# Patient Record
Sex: Male | Born: 1965
Health system: Southern US, Community
[De-identification: ages and names within clinical notes are randomized; demographics above are authoritative.]

## PROBLEM LIST (undated history)

## (undated) DIAGNOSIS — G4730 Sleep apnea, unspecified: Secondary | ICD-10-CM

## (undated) DIAGNOSIS — G4733 Obstructive sleep apnea (adult) (pediatric): Secondary | ICD-10-CM

## (undated) DIAGNOSIS — I10 Essential (primary) hypertension: Secondary | ICD-10-CM

## (undated) DIAGNOSIS — F32A Depression, unspecified: Secondary | ICD-10-CM

## (undated) DIAGNOSIS — G4700 Insomnia, unspecified: Secondary | ICD-10-CM

## (undated) DIAGNOSIS — Z9884 Bariatric surgery status: Secondary | ICD-10-CM

## (undated) DIAGNOSIS — G4736 Sleep related hypoventilation in conditions classified elsewhere: Secondary | ICD-10-CM

## (undated) DIAGNOSIS — R0609 Other forms of dyspnea: Secondary | ICD-10-CM

## (undated) DIAGNOSIS — F40240 Claustrophobia: Secondary | ICD-10-CM

## (undated) DIAGNOSIS — F329 Major depressive disorder, single episode, unspecified: Secondary | ICD-10-CM

## (undated) DIAGNOSIS — I5042 Chronic combined systolic (congestive) and diastolic (congestive) heart failure: Secondary | ICD-10-CM

## (undated) DIAGNOSIS — E662 Morbid (severe) obesity with alveolar hypoventilation: Secondary | ICD-10-CM

## (undated) DIAGNOSIS — I4892 Unspecified atrial flutter: Secondary | ICD-10-CM

## (undated) DIAGNOSIS — K5732 Diverticulitis of large intestine without perforation or abscess without bleeding: Secondary | ICD-10-CM

## (undated) HISTORY — PX: LAPAROSCOPIC GASTRIC BANDING: SHX1100

## (undated) HISTORY — DX: Diverticulitis of large intestine without perforation or abscess without bleeding: K57.32

## (undated) HISTORY — DX: Major depressive disorder, single episode, unspecified: F32.9

## (undated) HISTORY — DX: Bariatric surgery status: Z98.84

## (undated) HISTORY — DX: Morbid (severe) obesity with alveolar hypoventilation: E66.2

## (undated) HISTORY — DX: Morbid (severe) obesity due to excess calories: E66.01

## (undated) HISTORY — DX: Depression, unspecified: F32.A

## (undated) HISTORY — DX: Unspecified atrial flutter: I48.92

## (undated) HISTORY — DX: Essential (primary) hypertension: I10

## (undated) HISTORY — DX: Claustrophobia: F40.240

## (undated) HISTORY — DX: Other forms of dyspnea: R06.09

## (undated) HISTORY — DX: Chronic combined systolic (congestive) and diastolic (congestive) heart failure: I50.42

## (undated) HISTORY — DX: Insomnia, unspecified: G47.00

## (undated) HISTORY — PX: HERNIA REPAIR: SHX51

## (undated) HISTORY — PX: UMBILICAL HERNIA REPAIR: SHX196

## (undated) HISTORY — PX: TESTICLE REMOVAL: SHX68

## (undated) HISTORY — DX: Sleep related hypoventilation in conditions classified elsewhere: G47.36

## (undated) HISTORY — DX: Sleep apnea, unspecified: G47.30

---

## 2002-03-04 ENCOUNTER — Encounter: Admission: RE | Admit: 2002-03-04 | Discharge: 2002-03-04 | Payer: Self-pay | Admitting: Urology

## 2002-03-04 ENCOUNTER — Encounter: Payer: Self-pay | Admitting: Urology

## 2003-08-23 ENCOUNTER — Ambulatory Visit (HOSPITAL_BASED_OUTPATIENT_CLINIC_OR_DEPARTMENT_OTHER): Admission: RE | Admit: 2003-08-23 | Discharge: 2003-08-23 | Payer: Self-pay | Admitting: Urology

## 2003-08-23 ENCOUNTER — Encounter (INDEPENDENT_AMBULATORY_CARE_PROVIDER_SITE_OTHER): Payer: Self-pay | Admitting: Specialist

## 2003-08-23 ENCOUNTER — Ambulatory Visit (HOSPITAL_COMMUNITY): Admission: RE | Admit: 2003-08-23 | Discharge: 2003-08-23 | Payer: Self-pay | Admitting: Urology

## 2004-06-08 ENCOUNTER — Encounter (INDEPENDENT_AMBULATORY_CARE_PROVIDER_SITE_OTHER): Payer: Self-pay | Admitting: Specialist

## 2004-06-08 ENCOUNTER — Ambulatory Visit (HOSPITAL_COMMUNITY): Admission: RE | Admit: 2004-06-08 | Discharge: 2004-06-08 | Payer: Self-pay | Admitting: Urology

## 2004-06-08 ENCOUNTER — Ambulatory Visit (HOSPITAL_BASED_OUTPATIENT_CLINIC_OR_DEPARTMENT_OTHER): Admission: RE | Admit: 2004-06-08 | Discharge: 2004-06-08 | Payer: Self-pay | Admitting: Urology

## 2006-05-23 ENCOUNTER — Ambulatory Visit (HOSPITAL_COMMUNITY): Admission: RE | Admit: 2006-05-23 | Discharge: 2006-05-23 | Payer: Self-pay | Admitting: Urology

## 2006-05-23 ENCOUNTER — Encounter (INDEPENDENT_AMBULATORY_CARE_PROVIDER_SITE_OTHER): Payer: Self-pay | Admitting: Specialist

## 2008-09-15 ENCOUNTER — Ambulatory Visit (HOSPITAL_COMMUNITY): Admission: RE | Admit: 2008-09-15 | Discharge: 2008-09-15 | Payer: Self-pay | Admitting: Emergency Medicine

## 2008-11-03 ENCOUNTER — Ambulatory Visit (HOSPITAL_COMMUNITY): Admission: RE | Admit: 2008-11-03 | Discharge: 2008-11-03 | Payer: Self-pay | Admitting: Surgery

## 2008-11-05 ENCOUNTER — Ambulatory Visit (HOSPITAL_COMMUNITY): Admission: RE | Admit: 2008-11-05 | Discharge: 2008-11-05 | Payer: Self-pay | Admitting: Surgery

## 2008-11-16 ENCOUNTER — Encounter: Admission: RE | Admit: 2008-11-16 | Discharge: 2009-01-20 | Payer: Self-pay | Admitting: Surgery

## 2009-02-23 ENCOUNTER — Ambulatory Visit (HOSPITAL_COMMUNITY): Admission: RE | Admit: 2009-02-23 | Discharge: 2009-02-24 | Payer: Self-pay | Admitting: Surgery

## 2009-03-09 ENCOUNTER — Encounter: Admission: RE | Admit: 2009-03-09 | Discharge: 2009-06-07 | Payer: Self-pay | Admitting: Surgery

## 2010-11-19 LAB — HEMOGLOBIN AND HEMATOCRIT, BLOOD
HCT: 46.5 % (ref 39.0–52.0)
Hemoglobin: 15.9 g/dL (ref 13.0–17.0)

## 2010-11-19 LAB — DIFFERENTIAL
Basophils Absolute: 0 10*3/uL (ref 0.0–0.1)
Basophils Absolute: 0.1 10*3/uL (ref 0.0–0.1)
Basophils Relative: 0 % (ref 0–1)
Basophils Relative: 0 % (ref 0–1)
Eosinophils Absolute: 0.3 10*3/uL (ref 0.0–0.7)
Eosinophils Absolute: 0.4 10*3/uL (ref 0.0–0.7)
Eosinophils Relative: 3 % (ref 0–5)
Eosinophils Relative: 3 % (ref 0–5)
Lymphocytes Relative: 16 % (ref 12–46)
Lymphocytes Relative: 16 % (ref 12–46)
Lymphs Abs: 1.9 10*3/uL (ref 0.7–4.0)
Lymphs Abs: 2.3 10*3/uL (ref 0.7–4.0)
Monocytes Absolute: 0.8 10*3/uL (ref 0.1–1.0)
Monocytes Absolute: 0.9 10*3/uL (ref 0.1–1.0)
Monocytes Relative: 6 % (ref 3–12)
Monocytes Relative: 7 % (ref 3–12)
Neutro Abs: 10.6 10*3/uL — ABNORMAL HIGH (ref 1.7–7.7)
Neutro Abs: 8.7 10*3/uL — ABNORMAL HIGH (ref 1.7–7.7)
Neutrophils Relative %: 73 % (ref 43–77)
Neutrophils Relative %: 76 % (ref 43–77)

## 2010-11-19 LAB — COMPREHENSIVE METABOLIC PANEL
ALT: 14 U/L (ref 0–53)
AST: 22 U/L (ref 0–37)
Albumin: 4 g/dL (ref 3.5–5.2)
Alkaline Phosphatase: 92 U/L (ref 39–117)
BUN: 14 mg/dL (ref 6–23)
CO2: 23 mEq/L (ref 19–32)
Calcium: 9.5 mg/dL (ref 8.4–10.5)
Chloride: 112 mEq/L (ref 96–112)
Creatinine, Ser: 1.02 mg/dL (ref 0.4–1.5)
GFR calc Af Amer: >60 mL/min (ref >60)
GFR calc non Af Amer: >60 mL/min (ref >60)
Glucose, Bld: 112 mg/dL — ABNORMAL HIGH (ref 70–99)
Potassium: 3.7 mEq/L (ref 3.5–5.1)
Sodium: 142 mEq/L (ref 135–145)
Total Bilirubin: 0.9 mg/dL (ref 0.3–1.2)
Total Protein: 6.8 g/dL (ref 6.0–8.3)

## 2010-11-19 LAB — CBC
HCT: 45.5 % (ref 39.0–52.0)
HCT: 51.7 % (ref 39.0–52.0)
Hemoglobin: 15.5 g/dL (ref 13.0–17.0)
Hemoglobin: 17.8 g/dL — ABNORMAL HIGH (ref 13.0–17.0)
MCHC: 34.1 g/dL (ref 30.0–36.0)
MCHC: 34.4 g/dL (ref 30.0–36.0)
MCV: 93.4 fL (ref 78.0–100.0)
MCV: 94.3 fL (ref 78.0–100.0)
Platelets: 124 10*3/uL — ABNORMAL LOW (ref 150–400)
Platelets: 143 10*3/uL — ABNORMAL LOW (ref 150–400)
RBC: 4.83 MIL/uL (ref 4.22–5.81)
RBC: 5.53 MIL/uL (ref 4.22–5.81)
RDW: 13 % (ref 11.5–15.5)
RDW: 13.1 % (ref 11.5–15.5)
WBC: 11.9 10*3/uL — ABNORMAL HIGH (ref 4.0–10.5)
WBC: 14.1 10*3/uL — ABNORMAL HIGH (ref 4.0–10.5)

## 2010-12-26 NOTE — Op Note (Signed)
NAME:  Cory Jenkins, Cory Jenkins               ACCOUNT NO.:  [id number]   MEDICAL RECORD NO.:  [id number]          PATIENT TYPE:  OIB   LOCATION:  1522                         FACILITY:  [facility]   PHYSICIAN:  Thornton Park. Daphine Deutscher, MD  DATE OF BIRTH:  1966/05/05   DATE OF PROCEDURE:  02/23/2009  DATE OF DISCHARGE:                               OPERATIVE REPORT   PREOPERATIVE DIAGNOSIS:  Morbid obesity with umbilical hernia that has  been symptomatic.   POSTOPERATIVE DIAGNOSIS:  Morbid obesity with umbilical hernia that has  been symptomatic with incarcerated umbilical hernia containing  properitoneal fat.   PROCEDURE:  Lap band APL with umbilical hernia repair using an Ethicon 2  cm patch.   SURGEON:  Thornton Park. Daphine Deutscher, M.D.   ASSISTANT:  Sharlet Salina T. Hoxworth, M.D.   ANESTHESIA:  General endotracheal.   DESCRIPTION OF PROCEDURE:  [name] was taken to room 1 on  Wednesday, February 23, 2009 and given general anesthesia.  The abdomen was  prepped with a technique air equivalent and draped sterilely.  The  abdomen was entered going to the left upper quadrant using a 0 degree  OptiVu 10 mm without difficulty.  Once insufflation was achieved, we  used standard trocar placements.  He has a very large abdomen, and we  first put in a Nathanson retractor above and with a 10 in the right  upper quadrant and 11 below that, we visualized his umbilical hernia.  This was a very deep hernia at the bottom of a very deep __________ with  incarcerated properitoneal fat.  We will address that at the end.  Meanwhile, we went ahead and dissected the foregut, created a window on  the left side for the band. He had a lot of fat in the gastrohepatic  window.  This made this amenable to an APL band.  This was inserted and  tucked into the band passer and brought around and engaged in the  buckle.  The buckle snapped over the calibration tubing.  There is no  evidence of a hiatal hernia on upper GI nor were there  symptoms of one.  The band was then plicated with 3 sutures, and this looked good.  The  band was left in the abdomen wall.  We went down and inverted the  properitoneal hernia, excising the excess and the incarcerated fat.  I  then removed the excess from the inside, and we inserted the Ethicon  dual-sided umbilical hernia mesh, 2 cm diameter, and slid it in, pulled  it back, and sutured it to the small fascial defect, which was about a  centimeter in diameter with 2 sutures of #1 Novofil.  Wounds were  irrigated.  Meanwhile, the band tubing was connected to the port, which  had mesh on the  back and was tucked in the subcutaneous pocket in the lateral aspect of  the right lower incision.  All wounds were irrigated, injected with  Marcaine, and closed with 4-0 Vicryl subcutaneously, Benzoin, Steri-  Strips.  The patient seemed to tolerate the procedure well and was taken  to recovery room in  satisfactory condition.      Thornton Park Daphine Deutscher, MD  Electronically Signed     MBM/MEDQ  D:  02/23/2009  T:  02/23/2009  Job:  [id number]   cc:   Brett Canales A. Cleta Alberts, M.D.  Fax: [phone]

## 2011-09-13 ENCOUNTER — Telehealth (INDEPENDENT_AMBULATORY_CARE_PROVIDER_SITE_OTHER): Payer: Self-pay | Admitting: Surgery

## 2011-09-13 NOTE — Telephone Encounter (Signed)
09/13/11 recall letter mailed to patient for bariatric surgery follow-up. Adv pt to call our office at [phone] to schedule an appointment. CEF °

## 2012-08-29 ENCOUNTER — Telehealth (INDEPENDENT_AMBULATORY_CARE_PROVIDER_SITE_OTHER): Payer: Self-pay | Admitting: Surgery

## 2012-08-29 NOTE — Telephone Encounter (Signed)
08/29/12 spk to wife left message for pt to call and schedule a bariatric surgery follow-up appt with Dr. Daphine Deutscher. Also mailed recall letter. Pt hasn't been seen since lap band surgery on 02/23/09. (lss)

## 2015-02-16 ENCOUNTER — Ambulatory Visit (INDEPENDENT_AMBULATORY_CARE_PROVIDER_SITE_OTHER): Payer: BLUE CROSS/BLUE SHIELD | Admitting: Emergency Medicine

## 2015-02-16 ENCOUNTER — Encounter (HOSPITAL_COMMUNITY): Payer: Self-pay | Admitting: Emergency Medicine

## 2015-02-16 ENCOUNTER — Ambulatory Visit (INDEPENDENT_AMBULATORY_CARE_PROVIDER_SITE_OTHER): Payer: BLUE CROSS/BLUE SHIELD

## 2015-02-16 ENCOUNTER — Inpatient Hospital Stay (HOSPITAL_COMMUNITY)
Admission: EM | Admit: 2015-02-16 | Discharge: 2015-02-20 | DRG: 392 | Disposition: A | Discharge status: Home / Self Care | Payer: BLUE CROSS/BLUE SHIELD | Attending: General Surgery | Admitting: General Surgery

## 2015-02-16 ENCOUNTER — Emergency Department (HOSPITAL_COMMUNITY): Payer: BLUE CROSS/BLUE SHIELD

## 2015-02-16 VITALS — BP 130/78 | HR 104 | Temp 98.4°F | Resp 16 | Ht 72.0 in | Wt 269.6 lb

## 2015-02-16 DIAGNOSIS — D72829 Elevated white blood cell count, unspecified: Secondary | ICD-10-CM

## 2015-02-16 DIAGNOSIS — R3129 Other microscopic hematuria: Secondary | ICD-10-CM

## 2015-02-16 DIAGNOSIS — F17210 Nicotine dependence, cigarettes, uncomplicated: Secondary | ICD-10-CM | POA: Diagnosis present

## 2015-02-16 DIAGNOSIS — Z9884 Bariatric surgery status: Secondary | ICD-10-CM

## 2015-02-16 DIAGNOSIS — K5780 Diverticulitis of intestine, part unspecified, with perforation and abscess without bleeding: Secondary | ICD-10-CM

## 2015-02-16 DIAGNOSIS — R5383 Other fatigue: Secondary | ICD-10-CM | POA: Diagnosis not present

## 2015-02-16 DIAGNOSIS — R509 Fever, unspecified: Secondary | ICD-10-CM | POA: Diagnosis not present

## 2015-02-16 DIAGNOSIS — R1030 Lower abdominal pain, unspecified: Secondary | ICD-10-CM

## 2015-02-16 DIAGNOSIS — R822 Biliuria: Secondary | ICD-10-CM

## 2015-02-16 DIAGNOSIS — K5732 Diverticulitis of large intestine without perforation or abscess without bleeding: Secondary | ICD-10-CM | POA: Diagnosis present

## 2015-02-16 DIAGNOSIS — F329 Major depressive disorder, single episode, unspecified: Secondary | ICD-10-CM | POA: Diagnosis present

## 2015-02-16 DIAGNOSIS — R312 Other microscopic hematuria: Secondary | ICD-10-CM

## 2015-02-16 DIAGNOSIS — K5720 Diverticulitis of large intestine with perforation and abscess without bleeding: Secondary | ICD-10-CM | POA: Diagnosis not present

## 2015-02-16 HISTORY — DX: Diverticulitis of large intestine without perforation or abscess without bleeding: K57.32

## 2015-02-16 HISTORY — DX: Bariatric surgery status: Z98.84

## 2015-02-16 LAB — POCT CBC
Granulocyte percent: 87.7 %G — AB (ref 37–80)
HCT, POC: 48.8 % (ref 43.5–53.7)
Hemoglobin: 16.6 g/dL (ref 14.1–18.1)
Lymph, poc: 1.2 (ref 0.6–3.4)
MCH, POC: 30.2 pg (ref 27–31.2)
MCHC: 34 g/dL (ref 31.8–35.4)
MCV: 88.9 fL (ref 80–97)
MID (cbc): 0.6 (ref 0–0.9)
MPV: 9.1 fL (ref 0–99.8)
POC Granulocyte: 13.5 — AB (ref 2–6.9)
POC LYMPH PERCENT: 8.1 %L — AB (ref 10–50)
POC MID %: 4.2 %M (ref 0–12)
Platelet Count, POC: 165 10*3/uL (ref 142–424)
RBC: 5.49 M/uL (ref 4.69–6.13)
RDW, POC: 13.9 %
WBC: 15.4 10*3/uL — AB (ref 4.6–10.2)

## 2015-02-16 LAB — POCT URINALYSIS DIPSTICK
Glucose, UA: NEGATIVE
Leukocytes, UA: NEGATIVE
Nitrite, UA: POSITIVE
Protein, UA: 100
Spec Grav, UA: >=1.03
Urobilinogen, UA: 2
pH, UA: 5.5

## 2015-02-16 LAB — CBC WITH DIFFERENTIAL/PLATELET
Basophils Absolute: 0 10*3/uL (ref 0.0–0.1)
Basophils Relative: 0 % (ref 0–1)
EOS PCT: 1 % (ref 0–5)
Eosinophils Absolute: 0.1 10*3/uL (ref 0.0–0.7)
HCT: 48.4 % (ref 39.0–52.0)
HEMOGLOBIN: 16.7 g/dL (ref 13.0–17.0)
LYMPHS ABS: 1.1 10*3/uL (ref 0.7–4.0)
LYMPHS PCT: 8 % — AB (ref 12–46)
MCH: 31.6 pg (ref 26.0–34.0)
MCHC: 34.5 g/dL (ref 30.0–36.0)
MCV: 91.5 fL (ref 78.0–100.0)
MONOS PCT: 8 % (ref 3–12)
Monocytes Absolute: 1.2 10*3/uL — ABNORMAL HIGH (ref 0.1–1.0)
Neutro Abs: 11.8 10*3/uL — ABNORMAL HIGH (ref 1.7–7.7)
Neutrophils Relative %: 83 % — ABNORMAL HIGH (ref 43–77)
Platelets: 140 10*3/uL — ABNORMAL LOW (ref 150–400)
RBC: 5.29 MIL/uL (ref 4.22–5.81)
RDW: 13.7 % (ref 11.5–15.5)
WBC: 14.3 10*3/uL — AB (ref 4.0–10.5)

## 2015-02-16 LAB — POC HEMOCCULT BLD/STL (OFFICE/1-CARD/DIAGNOSTIC): Fecal Occult Blood, POC: NEGATIVE

## 2015-02-16 LAB — I-STAT CG4 LACTIC ACID, ED: Lactic Acid, Venous: 0.91 mmol/L (ref 0.5–2.0)

## 2015-02-16 LAB — POCT UA - MICROSCOPIC ONLY
Bacteria, U Microscopic: NEGATIVE
Casts, Ur, LPF, POC: NEGATIVE
Crystals, Ur, HPF, POC: NEGATIVE
Epithelial cells, urine per micros: NEGATIVE
Mucus, UA: POSITIVE
Yeast, UA: NEGATIVE

## 2015-02-16 LAB — COMPREHENSIVE METABOLIC PANEL
ALBUMIN: 3.7 g/dL (ref 3.5–5.0)
ALT: 9 U/L — AB (ref 17–63)
AST: 14 U/L — ABNORMAL LOW (ref 15–41)
Alkaline Phosphatase: 75 U/L (ref 38–126)
Anion gap: 8 (ref 5–15)
BUN: 13 mg/dL (ref 6–20)
CO2: 23 mmol/L (ref 22–32)
Calcium: 8.9 mg/dL (ref 8.9–10.3)
Chloride: 104 mmol/L (ref 101–111)
Creatinine, Ser: 1.07 mg/dL (ref 0.61–1.24)
GFR calc non Af Amer: >60 mL/min (ref >60)
GLUCOSE: 94 mg/dL (ref 65–99)
POTASSIUM: 4 mmol/L (ref 3.5–5.1)
Sodium: 135 mmol/L (ref 135–145)
TOTAL PROTEIN: 6.9 g/dL (ref 6.5–8.1)
Total Bilirubin: 1 mg/dL (ref 0.3–1.2)

## 2015-02-16 LAB — GLUCOSE, POCT (MANUAL RESULT ENTRY): POC Glucose: 89 mg/dl (ref 70–99)

## 2015-02-16 LAB — LIPASE, BLOOD: Lipase: 11 U/L — ABNORMAL LOW (ref 22–51)

## 2015-02-16 MED ORDER — CIPROFLOXACIN IN D5W 400 MG/200ML IV SOLN
400.0000 mg | Freq: Two times a day (BID) | INTRAVENOUS | Status: DC
  Administered 2015-02-16 – 2015-02-19 (×7): 400 mg via INTRAVENOUS
  Filled 2015-02-16 (×8): qty 200

## 2015-02-16 MED ORDER — NORTRIPTYLINE HCL 25 MG PO CAPS
75.0000 mg | ORAL_CAPSULE | Freq: Every day | ORAL | Status: DC
  Administered 2015-02-16: 75 mg via ORAL
  Filled 2015-02-16 (×2): qty 3

## 2015-02-16 MED ORDER — ENOXAPARIN SODIUM 40 MG/0.4ML ~~LOC~~ SOLN
40.0000 mg | SUBCUTANEOUS | Status: DC
  Administered 2015-02-16 – 2015-02-19 (×4): 40 mg via SUBCUTANEOUS
  Filled 2015-02-16 (×6): qty 0.4

## 2015-02-16 MED ORDER — ZOLPIDEM TARTRATE 10 MG PO TABS
10.0000 mg | ORAL_TABLET | Freq: Every evening | ORAL | Status: DC | PRN
  Administered 2015-02-18 – 2015-02-19 (×2): 10 mg via ORAL
  Filled 2015-02-16 (×2): qty 1

## 2015-02-16 MED ORDER — IOHEXOL 300 MG/ML  SOLN
100.0000 mL | Freq: Once | INTRAMUSCULAR | Status: AC | PRN
  Administered 2015-02-16: 100 mL via INTRAVENOUS

## 2015-02-16 MED ORDER — IOHEXOL 300 MG/ML  SOLN
50.0000 mL | Freq: Once | INTRAMUSCULAR | Status: AC | PRN
  Administered 2015-02-16: 50 mL via ORAL

## 2015-02-16 MED ORDER — ONDANSETRON HCL 4 MG/2ML IJ SOLN: 4.0000 mg | Freq: Once | INTRAMUSCULAR | Status: DC

## 2015-02-16 MED ORDER — ONDANSETRON HCL 4 MG/2ML IJ SOLN
4.0000 mg | Freq: Four times a day (QID) | INTRAMUSCULAR | Status: DC | PRN
  Administered 2015-02-18: 4 mg via INTRAVENOUS
  Filled 2015-02-16: qty 2

## 2015-02-16 MED ORDER — SODIUM CHLORIDE 0.9 % IV BOLUS (SEPSIS)
1000.0000 mL | Freq: Once | INTRAVENOUS | Status: AC
  Administered 2015-02-16: 1000 mL via INTRAVENOUS

## 2015-02-16 MED ORDER — POTASSIUM CHLORIDE IN NACL 20-0.9 MEQ/L-% IV SOLN
INTRAVENOUS | Status: DC
  Administered 2015-02-16: 125 mL/h via INTRAVENOUS
  Administered 2015-02-17 (×2): via INTRAVENOUS
  Administered 2015-02-18: 75 mL/h via INTRAVENOUS
  Administered 2015-02-18: 125 mL/h via INTRAVENOUS
  Administered 2015-02-19 – 2015-02-20 (×2): via INTRAVENOUS
  Filled 2015-02-16 (×9): qty 1000

## 2015-02-16 MED ORDER — OXYCODONE HCL 5 MG PO TABS
5.0000 mg | ORAL_TABLET | ORAL | Status: DC | PRN
  Administered 2015-02-17 – 2015-02-18 (×2): 5 mg via ORAL
  Filled 2015-02-16 (×2): qty 1

## 2015-02-16 MED ORDER — HYDROMORPHONE HCL 1 MG/ML IJ SOLN
1.0000 mg | INTRAMUSCULAR | Status: DC | PRN
  Administered 2015-02-16 – 2015-02-18 (×4): 1 mg via INTRAVENOUS
  Filled 2015-02-16 (×4): qty 1

## 2015-02-16 MED ORDER — MORPHINE SULFATE 4 MG/ML IJ SOLN
4.0000 mg | Freq: Once | INTRAMUSCULAR | Status: AC
  Administered 2015-02-16: 4 mg via INTRAVENOUS
  Filled 2015-02-16: qty 1

## 2015-02-16 MED ORDER — METRONIDAZOLE IN NACL 5-0.79 MG/ML-% IV SOLN
500.0000 mg | Freq: Once | INTRAVENOUS | Status: AC
  Administered 2015-02-16: 500 mg via INTRAVENOUS
  Filled 2015-02-16: qty 100

## 2015-02-16 MED ORDER — ONDANSETRON HCL 4 MG/2ML IJ SOLN
4.0000 mg | Freq: Once | INTRAMUSCULAR | Status: AC
  Administered 2015-02-16: 4 mg via INTRAVENOUS
  Filled 2015-02-16: qty 2

## 2015-02-16 MED ORDER — CIPROFLOXACIN IN D5W 400 MG/200ML IV SOLN
400.0000 mg | Freq: Once | INTRAVENOUS | Status: DC
  Filled 2015-02-16: qty 200

## 2015-02-16 MED ORDER — METRONIDAZOLE IN NACL 5-0.79 MG/ML-% IV SOLN
500.0000 mg | Freq: Three times a day (TID) | INTRAVENOUS | Status: DC
  Administered 2015-02-17 – 2015-02-20 (×10): 500 mg via INTRAVENOUS
  Filled 2015-02-16 (×11): qty 100

## 2015-02-16 NOTE — ED Notes (Signed)
Bed: WA24 Expected date:  Expected time:  Means of arrival:  [facility]

## 2015-02-16 NOTE — ED Notes (Signed)
Patient transported to CT 

## 2015-02-16 NOTE — H&P (Signed)
Cory Jenkins is an 49 y.o. male.    Chief Complaint: Abdominal pain  HPI: Patient is a 49 year old male who was in his usual state of health until 3 days ago when he developed the fairly rapid onset of lower midline abdominal pain that was quite severe. He was taking antibiotics for a dental procedure at that time and had had some GI issues with oral antibiotics before. He thought this was the problem and stop the amoxicillin. He stated the pain was most intense 3 days ago but has been constant and still significant over the last couple of days. He says today is a [place] better than yesterday. He had chills 3 days ago but none since that time. Normal bowel movement yesterday. No melena or hematochezia or diarrhea. He has not had any similar GI issues or history of diverticulitis. He does have a lap band placed which causes occasional vomiting but no different than what he is used to. No urinary symptoms. He presented to urgent care due to the persistent pain and was referred to the [facility] emergency room for further evaluation.  Past Medical History  Diagnosis Date  . Depression     Past Surgical History  Procedure Laterality Date  . Laparoscopic gastric banding    . Orchiectomy      History reviewed. No pertinent family history. Social History:  reports that he has been smoking.  He has never used smokeless tobacco. He reports that he does not drink alcohol or use illicit drugs.  Allergies:  Allergies  Allergen Reactions  . Keflex [Cephalexin] Other (See Comments)    Abdominal pain  . Sulfa Antibiotics Hives     (Not in a hospital admission)  Results for orders placed or performed during the hospital encounter of 02/16/15 (from the past 48 hour(s))  CBC with Differential     Status: Abnormal   Collection Time: 02/16/15  3:17 PM  Result Value Ref Range   WBC 14.3 (H) 4.0 - 10.5 K/uL   RBC 5.29 4.22 - 5.81 MIL/uL   Hemoglobin 16.7 13.0 - 17.0 g/dL   HCT 48.4 39.0 - 52.0 %    MCV 91.5 78.0 - 100.0 fL   MCH 31.6 26.0 - 34.0 pg   MCHC 34.5 30.0 - 36.0 g/dL   RDW 13.7 11.5 - 15.5 %   Platelets 140 (L) 150 - 400 K/uL   Neutrophils Relative % 83 (H) 43 - 77 %   Neutro Abs 11.8 (H) 1.7 - 7.7 K/uL   Lymphocytes Relative 8 (L) 12 - 46 %   Lymphs Abs 1.1 0.7 - 4.0 K/uL   Monocytes Relative 8 3 - 12 %   Monocytes Absolute 1.2 (H) 0.1 - 1.0 K/uL   Eosinophils Relative 1 0 - 5 %   Eosinophils Absolute 0.1 0.0 - 0.7 K/uL   Basophils Relative 0 0 - 1 %   Basophils Absolute 0.0 0.0 - 0.1 K/uL  Comprehensive metabolic panel     Status: Abnormal   Collection Time: 02/16/15  3:17 PM  Result Value Ref Range   Sodium 135 135 - 145 mmol/L   Potassium 4.0 3.5 - 5.1 mmol/L   Chloride 104 101 - 111 mmol/L   CO2 23 22 - 32 mmol/L   Glucose, Bld 94 65 - 99 mg/dL   BUN 13 6 - 20 mg/dL   Creatinine, Ser 1.07 0.61 - 1.24 mg/dL   Calcium 8.9 8.9 - 10.3 mg/dL   Total Protein 6.9 6.5 -  8.1 g/dL   Albumin 3.7 3.5 - 5.0 g/dL   AST 14 (L) 15 - 41 U/L   ALT 9 (L) 17 - 63 U/L   Alkaline Phosphatase 75 38 - 126 U/L   Total Bilirubin 1.0 0.3 - 1.2 mg/dL   GFR calc non Af Amer >60 >60 mL/min   GFR calc Af Amer >60 >60 mL/min    Comment: (NOTE) The eGFR has been calculated using the CKD EPI equation. This calculation has not been validated in all clinical situations. eGFR's persistently <60 mL/min signify possible Chronic Kidney Disease.    Anion gap 8 5 - 15  Lipase, blood     Status: Abnormal   Collection Time: 02/16/15  3:17 PM  Result Value Ref Range   Lipase 11 (L) 22 - 51 U/L  I-Stat CG4 Lactic Acid, ED     Status: None   Collection Time: 02/16/15  7:56 PM  Result Value Ref Range   Lactic Acid, Venous 0.91 0.5 - 2.0 mmol/L   Ct Abdomen Pelvis W Contrast  02/16/2015   CLINICAL DATA:  49 year old male with history of nausea, fever, chills and lower abdominal pain for the past 3 days. No diarrhea or emesis.  EXAM: CT ABDOMEN AND PELVIS WITH CONTRAST  TECHNIQUE:  Multidetector CT imaging of the abdomen and pelvis was performed using the standard protocol following bolus administration of intravenous contrast.  CONTRAST:  13m OMNIPAQUE IOHEXOL 300 MG/ML  SOLN  COMPARISON:  No priors.  FINDINGS: Lower chest:  Lipomatous hypertrophy of the interatrial septum.  Hepatobiliary: No cystic or solid hepatic lesions. No intra or extrahepatic biliary ductal dilatation. 2 mm gallstone lying dependently in the neck of the gallbladder. No current findings to suggest an acute cholecystitis at this time.  Pancreas: No pancreatic mass. No pancreatic ductal dilatation. No pancreatic or peripancreatic fluid or inflammatory changes.  Spleen: Unremarkable.  Adrenals/Urinary Tract: 4 cm well-defined nonenhancing lesion in the upper pole of the right kidney is compatible with an exophytic simple cyst. Left kidney is normal in appearance. Bilateral adrenal glands are normal in appearance. No hydroureteronephrosis. Urinary bladder is normal in appearance.  Stomach/Bowel: In the mid sigmoid colon there are extensive inflammatory changes adjacent to several colonic diverticulitis. Image 76 of series 2 and coronal image 53 of series 5 demonstrate what appears to be a contained perforation within the sigmoid mesocolon adjacent to an inflamed diverticulum. This has gas and a small amount of feculent material, which could represent an early diverticular abscess. This measures approximately 2.3 x 1.3 cm.  Vascular/Lymphatic: Atherosclerosis throughout the abdominal and pelvic vasculature, without evidence of aneurysm. No lymphadenopathy noted in the abdomen or pelvis.  Reproductive: Prostate gland and seminal vesicles are unremarkable in appearance.  Other: Trace volume of ascites. Although there is a small amount of extraluminal gas within the sigmoid mesocolon, this appears be isolated. No pneumoperitoneum is otherwise noted. LapBand in position, with reservoir in the subcutaneous fat overlying the  right side of the mid abdomen.  Musculoskeletal: There are no aggressive appearing lytic or blastic lesions noted in the visualized portions of the skeleton.  IMPRESSION: 1. Severe acute diverticulitis of the mid sigmoid colon with a contained perforation in the sigmoid mesocolon, with what appears to be a small developing diverticular abscess, as detailed above. Surgical consultation is strongly recommended. 2. Cholelithiasis without evidence acute cholecystitis. 3. Additional incidental findings, as above. These results were called by telephone at the time of interpretation on 02/16/2015 at 7:00 pm to  Dr. Delos Haring, who verbally acknowledged these results.   Electronically Signed   By: Vinnie Langton M.D.   On: 02/16/2015 19:00   Dg Abd Acute W/chest  02/16/2015   CLINICAL DATA:  49 year old male with lower abdominal pain. Initial encounter.  EXAM: DG ABDOMEN ACUTE W/ 1V CHEST  COMPARISON:  02/24/2009.  FINDINGS: Lung volumes are within normal limits. Normal cardiac size and mediastinal contours. No pneumothorax or pneumoperitoneum. No confluent pulmonary opacity.  Large body habitus and quantum mottle artifact. Sequelae of gastric banding again noted. Anatomic detail of the band today is limited. Non obstructed bowel gas pattern. Abdominal and pelvic visceral contours are within normal limits. No acute osseous abnormality identified.  IMPRESSION: 1.  Normal bowel gas pattern, no free air. 2. Sequelae of gastric banding re- identified, but detail of the band is limited on these images. 3.  No acute cardiopulmonary abnormality.   Electronically Signed   By: Genevie Ann M.D.   On: 02/16/2015 15:22    Review of Systems  Constitutional: Positive for chills and malaise/fatigue. Negative for fever.  Respiratory: Negative.   Cardiovascular: Negative.   Gastrointestinal: Positive for abdominal pain. Negative for nausea, vomiting, diarrhea, constipation and blood in stool.  Genitourinary: Negative.    Psychiatric/Behavioral: Positive for depression.    Blood pressure 139/94, pulse 107, temperature 100.1 F (37.8 C), temperature source Oral, resp. rate 20, height 6' (1.829 m), weight 117.935 kg (260 lb), SpO2 98 %. Physical Exam  General: Alert, Obese Caucasian male, in no distress Skin: Warm and dry without rash or infection. HEENT: No palpable masses or thyromegaly. Sclera nonicteric. Pupils equal round and reactive. Oropharynx clear. Lymph nodes: No cervical, supraclavicular, or inguinal nodes palpable. Lungs: Breath sounds clear and equal without increased work of breathing Cardiovascular: Regular rate and rhythm without murmur. No  edema. Peripheral pulses intact. Abdomen: Obese. Bowel sounds present. There is localized suprapubic tenderness with minimal guarding and no peritoneal signs. Remainder of his abdomen is soft and nontender. LAP-BAND port in right upper quadrant unremarkable.. No masses palpable. No organomegaly. No palpable hernias. Extremities: No edema or joint swelling or deformity. No chronic venous stasis changes. Neurologic: Alert and fully oriented. Affect normal.  Assessment/Plan Acute sigmoid diverticulitis. He has a very significant phlegmon around the sigmoid colon with a pocket of extraluminal air probably within the mesentery. His abdomen is relatively benign on exam. We will admit for bowel rest, IV antibiotics and close observation with initial nonoperative management. Discussed with the patient and his family and all questions answered.  Dominic Mahaney T 02/16/2015, 8:39 PM

## 2015-02-16 NOTE — Progress Notes (Addendum)
Subjective:  This chart was scribed for Cory Lites, MD by Andrew Au, ED Scribe. This patient was seen in room 12 and the patient's care was started at 1:01 PM.   Patient ID: Cory Jenkins, male    DOB: [birth year], 49 y.o.   MRN: [id number]  HPI   Chief Complaint  Patient presents with  . Abdominal Pain  . Nausea   HPI Comments: Cory Jenkins is a 49 y.o. male who presents to the [facility] complaining of low abdominal pain 5-6 days ago. Pt had tooth extracted 6 days ago and was taking amoxicillin. He began to have low abdominal pain and stopped taking medication, but reports abdominal pain has persisted. He reports associated sweats last night, nausea and decreased appetite. He's had normal stools and denies diarrhea.  Pt states he's had similar reaction when taking keflex in the past. He denies testicular swelling and pain.   No past medical history on file. No past surgical history on file. Prior to Admission medications   Medication Sig Start Date End Date Taking? Authorizing Provider  nortriptyline (PAMELOR) 25 MG capsule Take 30 mg by mouth 3 (three) times daily.   Yes Historical Provider, MD  oxyCODONE-acetaminophen (PERCOCET) 10-325 MG per tablet Take 1 tablet by mouth every 4 (four) hours as needed for pain.   Yes Historical Provider, MD  zolpidem (AMBIEN) 10 MG tablet Take 10 mg by mouth at bedtime as needed for sleep.   Yes Historical Provider, MD   Review of Systems  Constitutional: Positive for diaphoresis and appetite change (decreased).  Gastrointestinal: Positive for nausea and abdominal pain. Negative for vomiting, diarrhea and constipation.  Genitourinary: Negative for scrotal swelling and testicular pain.   Objective:   Physical Exam CONSTITUTIONAL: Well developed/well nourished HEAD: Normocephalic/atraumatic EYES: EOMI/PERRL ENMT: Mucous membranes moist NECK: supple no meningeal signs SPINE/BACK:entire spine nontender CV: S1/S2 noted,  no murmurs/rubs/gallops noted LUNGS: Lungs are clear to auscultation bilaterally, no apparent distress ABDOMEN: soft, no rebound or guarding, bowel sounds noted throughout abdomen. Not distended. Significant tenderness to left lower quadrant and suprapubic.  GU:no cva tenderness prostate normal. Surgically absent left testicle.  NEURO: Pt is awake/alert/appropriate, moves all extremitiesx4.  No facial droop.   EXTREMITIES: pulses normal/equal, full ROM SKIN: warm, color normal PSYCH: no abnormalities of mood noted, alert and oriented to situation  Filed Vitals:   02/16/15 1255  BP: 130/78  Pulse: 104  Temp: 98.4 F (36.9 C)  TempSrc: Oral  Resp: 16  Height: 6' (1.829 m)  Weight: 269 lb 9.6 oz (122.29 kg)  SpO2: 97%   Results for orders placed or performed in visit on 02/16/15  POCT CBC  Result Value Ref Range   WBC 15.4 (A) 4.6 - 10.2 K/uL   Lymph, poc 1.2 0.6 - 3.4   POC LYMPH PERCENT 8.1 (A) 10 - 50 %L   MID (cbc) 0.6 0 - 0.9   POC MID % 4.2 0 - 12 %M   POC Granulocyte 13.5 (A) 2 - 6.9   Granulocyte percent 87.7 (A) 37 - 80 %G   RBC 5.49 4.69 - 6.13 M/uL   Hemoglobin 16.6 14.1 - 18.1 g/dL   HCT, POC 40.9 81.1 - 53.7 %   MCV 88.9 80 - 97 fL   MCH, POC 30.2 27 - 31.2 pg   MCHC 34.0 31.8 - 35.4 g/dL   RDW, POC 91.4 %   Platelet Count, POC 165 142 - 424 K/uL  MPV 9.1 0 - 99.8 fL  POCT glucose (manual entry)  Result Value Ref Range   POC Glucose 89 70 - 99 mg/dl  POCT urinalysis dipstick  Result Value Ref Range   Color, UA brown    Clarity, UA cloudy    Glucose, UA neg    Bilirubin, UA moderate    Ketones, UA trace    Spec Grav, UA >=1.030    Blood, UA large    pH, UA 5.5    Protein, UA 100    Urobilinogen, UA 2.0    Nitrite, UA pos    Leukocytes, UA Negative Negative  POC Hemoccult Bld/Stl (1-Cd Office Dx)  Result Value Ref Range   Card #1 Date     Fecal Occult Blood, POC Negative Negative   [facility] reading (PRIMARY) by Dr. Cleta Albertsaub. Abdomen.  There are 2 small  air-fluid levels in the right lower quadrant paucity of air in the left side of the abdomen. There is a lap band  Apparatus noted  Assessment & Plan:   Patient presents with severe lower abdominal pain. He has an acute abdomen. Symptoms have been present for 3 days. He does have a LAP-BAND. I did send off a urine culture. He has significant bilirubin in his urine etiology unknown. [facility] so they can proceed with further evaluation and imaging. I discussed the issue with his father who is a cardiologist.

## 2015-02-16 NOTE — Progress Notes (Signed)
Utilization Review completed.  Iracema Lanagan RN CM  

## 2015-02-16 NOTE — ED Provider Notes (Signed)
CSN: [id number]     Arrival date & time 02/16/15  1437 History   First MD Initiated Contact with Patient 02/16/15 1631     Chief Complaint  Patient presents with  . Abdominal Pain     (Consider location/radiation/quality/duration/timing/severity/associated sxs/prior Treatment) HPI    PCP: No primary care provider on file. Blood pressure 125/74, pulse 105, temperature 98.5 F (36.9 C), temperature source Oral, resp. rate 18, height 6' (1.829 m), weight 260 lb (117.935 kg), SpO2 97 %.  Cory Jenkins is a 49 y.o.male with a significant PMH of nort presents to the ER with complaints of depression.    AL is an otherwise healthy 49 y/o male who presents to the ED with 7/10 crampy suprapubic abdominal pain x 3 days, he feels his pain is actually improving it was previously 10/10. He recently had a tooth extraction and was on amoxicillin which he discontinued on 3 days ago because he thought it was the source of his pain, as he had similar cramping from keflex, but the pain has not gone away. He reports having a decreased appetite, mild nausea, and chills. He denies having fever, vomiting, diarrhea, dysuria, frequency, or urgency. He has been taking percocet for his pain that he had from his dental work, with good relief of his pain. He has h/o kidney stone and lap-band surgery, but still has all abdominal organs.  The patient denies diaphoresis, fever, headache, weakness (general or focal), confusion, change of vision,  neck pain, dysphagia, aphagia, chest pain, shortness of breath,  back pain, nausea, lower extremity swelling, rash.   Past Medical History  Diagnosis Date  . Depression    Past Surgical History  Procedure Laterality Date  . Laparoscopic gastric banding     History reviewed. No pertinent family history. History  Substance Use Topics  . Smoking status: Light Tobacco Smoker  . Smokeless tobacco: Not on file  . Alcohol Use: Not on file    Review of Systems  10 Systems  reviewed and are negative for acute change except as noted in the HPI.     Allergies  Keflex and Sulfa antibiotics  Home Medications   Prior to Admission medications   Medication Sig Start Date End Date Taking? Authorizing Provider  nortriptyline (PAMELOR) 25 MG capsule Take 75 mg by mouth daily.    Yes Historical Provider, MD  oxyCODONE-acetaminophen (PERCOCET/ROXICET) 5-325 MG per tablet Take 1-2 tablets by mouth every 4 (four) hours as needed for moderate pain or severe pain.  02/10/15  Yes Historical Provider, MD  zolpidem (AMBIEN) 10 MG tablet Take 10 mg by mouth at bedtime as needed for sleep.   Yes Historical Provider, MD   BP 125/74 mmHg  Pulse 105  Temp(Src) 98.5 F (36.9 C) (Oral)  Resp 18  Ht 6' (1.829 m)  Wt 260 lb (117.935 kg)  BMI 35.25 kg/m2  SpO2 97% Physical Exam  Constitutional: He appears well-developed and well-nourished. No distress.  HENT:  Head: Normocephalic and atraumatic.  Eyes: Pupils are equal, round, and reactive to light.  Neck: Normal range of motion. Neck supple.  Cardiovascular: Regular rhythm.  Tachycardia present.   Pulmonary/Chest: Effort normal.  Abdominal: Soft. Bowel sounds are normal. He exhibits no distension. There is tenderness. There is guarding. There is no rigidity, no rebound and no CVA tenderness.    Neurological: He is alert.  Skin: Skin is warm and dry. He is not diaphoretic.  Nursing note and vitals reviewed.   ED Course  Procedures (including critical care time) Labs Review Labs Reviewed  CBC WITH DIFFERENTIAL/PLATELET - Abnormal; Notable for the following:    WBC 14.3 (*)    Platelets 140 (*)    Neutrophils Relative % 83 (*)    Neutro Abs 11.8 (*)    Lymphocytes Relative 8 (*)    Monocytes Absolute 1.2 (*)    All other components within normal limits  COMPREHENSIVE METABOLIC PANEL - Abnormal; Notable for the following:    AST 14 (*)    ALT 9 (*)    All other components within normal limits  LIPASE, BLOOD -  Abnormal; Notable for the following:    Lipase 11 (*)    All other components within normal limits  CULTURE, BLOOD (ROUTINE X 2)  CULTURE, BLOOD (ROUTINE X 2)  I-STAT CG4 LACTIC ACID, ED    Imaging Review Ct Abdomen Pelvis W Contrast  02/16/2015   CLINICAL DATA:  49 year old male with history of nausea, fever, chills and lower abdominal pain for the past 3 days. No diarrhea or emesis.  EXAM: CT ABDOMEN AND PELVIS WITH CONTRAST  TECHNIQUE: Multidetector CT imaging of the abdomen and pelvis was performed using the standard protocol following bolus administration of intravenous contrast.  CONTRAST:  OMNIPAQUE IOHEXOL 300 MG/ML  SOLN  COMPARISON:  No priors.  FINDINGS: Lower chest:  Lipomatous hypertrophy of the interatrial septum.  Hepatobiliary: No cystic or solid hepatic lesions. No intra or extrahepatic biliary ductal dilatation. 2 mm gallstone lying dependently in the neck of the gallbladder. No current findings to suggest an acute cholecystitis at this time.  Pancreas: No pancreatic mass. No pancreatic ductal dilatation. No pancreatic or peripancreatic fluid or inflammatory changes.  Spleen: Unremarkable.  Adrenals/Urinary Tract: 4 cm well-defined nonenhancing lesion in the upper pole of the right kidney is compatible with an exophytic simple cyst. Left kidney is normal in appearance. Bilateral adrenal glands are normal in appearance. No hydroureteronephrosis. Urinary bladder is normal in appearance.  Stomach/Bowel: In the mid sigmoid colon there are extensive inflammatory changes adjacent to several colonic diverticulitis. Image 76 of series 2 and coronal image 53 of series 5 demonstrate what appears to be a contained perforation within the sigmoid mesocolon adjacent to an inflamed diverticulum. This has gas and a small amount of feculent material, which could represent an early diverticular abscess. This measures approximately 2.3 x 1.3 cm.  Vascular/Lymphatic: Atherosclerosis throughout the  abdominal and pelvic vasculature, without evidence of aneurysm. No lymphadenopathy noted in the abdomen or pelvis.  Reproductive: Prostate gland and seminal vesicles are unremarkable in appearance.  Other: Trace volume of ascites. Although there is a small amount of extraluminal gas within the sigmoid mesocolon, this appears be isolated. No pneumoperitoneum is otherwise noted. LapBand in position, with reservoir in the subcutaneous fat overlying the right side of the mid abdomen.  Musculoskeletal: There are no aggressive appearing lytic or blastic lesions noted in the visualized portions of the skeleton.  IMPRESSION: 1. Severe acute diverticulitis of the mid sigmoid colon with a contained perforation in the sigmoid mesocolon, with what appears to be a small developing diverticular abscess, as detailed above. Surgical consultation is strongly recommended. 2. Cholelithiasis without evidence acute cholecystitis. 3. Additional incidental findings, as above. These results were called by telephone at the time of interpretation on 02/16/2015 at 7:00 pm to Dr. Marlon Pel, who verbally acknowledged these results.   Electronically Signed   By: Trudie Reed M.D.   On: 02/16/2015 19:00   Dg Abd Acute  W/chest  02/16/2015   CLINICAL DATA:  49 year old male with lower abdominal pain. Initial encounter.  EXAM: DG ABDOMEN ACUTE W/ 1V CHEST  COMPARISON:  02/24/2009.  FINDINGS: Lung volumes are within normal limits. Normal cardiac size and mediastinal contours. No pneumothorax or pneumoperitoneum. No confluent pulmonary opacity.  Large body habitus and quantum mottle artifact. Sequelae of gastric banding again noted. Anatomic detail of the band today is limited. Non obstructed bowel gas pattern. Abdominal and pelvic visceral contours are within normal limits. No acute osseous abnormality identified.  IMPRESSION: 1.  Normal bowel gas pattern, no free air. 2. Sequelae of gastric banding re- identified, but detail of the band is  limited on these images. 3.  No acute cardiopulmonary abnormality.   Electronically Signed   By: Odessa Fleming M.D.   On: 02/16/2015 15:22     EKG Interpretation None      MDM   Final diagnoses:  Lower abdominal pain  Diverticulitis of intestine with perforation and abscess without bleeding    Patient with abdominal pain and CT scan shows bowel perforation with beginning abscess due to severe diverticulitis. The family and patient have been made aware, the general surgeon has agreed to come see patient in the ED. Will most likely admit to medicine.  Filed Vitals:   02/16/15 1738  BP: 125/74  Pulse: 105  Temp:   Resp: 18   Medications  ondansetron (ZOFRAN) injection 4 mg (4 mg Intravenous Not Given 02/16/15 1714)  ciprofloxacin (CIPRO) IVPB 400 mg (not administered)  metroNIDAZOLE (FLAGYL) IVPB 500 mg (500 mg Intravenous New Bag/Given 02/16/15 2033)  iohexol (OMNIPAQUE) 300 MG/ML solution 50 mL (50 mLs Oral Contrast Given 02/16/15 1649)  sodium chloride 0.9 % bolus 1,000 mL (1,000 mLs Intravenous New Bag/Given 02/16/15 1736)  iohexol (OMNIPAQUE) 300 MG/ML solution 100 mL (100 mLs Intravenous Contrast Given 02/16/15 1836)  morphine 4 MG/ML injection 4 mg (4 mg Intravenous Given 02/16/15 1902)  ondansetron (ZOFRAN) injection 4 mg (4 mg Intravenous Given 02/16/15 1902)     Dr. Johna Sheriff - the general surgeon has seen the patient and has agreed to admit.  Marlon Pel, PA-C 02/16/15 2102  Purvis Sheffield, MD 02/16/15 [phone]

## 2015-02-16 NOTE — ED Notes (Signed)
Pt, being sent by Daub MD, c/o lower abdominal pain, fever, chills, and nausea x 3 days.  Provider reports that the Pt has a WBC 15.4 and had an abdominal series completed earlier.  Sts the xrays had not been read when he called report. Hx of lap band in 2010.

## 2015-02-16 NOTE — ED Notes (Signed)
Patient cleaned with antibacterial wipes for surgery

## 2015-02-16 NOTE — ED Notes (Addendum)
Pt c/o nausea, fever, chills, and low abdominal pain x 3 days, sent by Dr. Cleta Albertsaub. Denies diarrhea or emesis. Pt has been on amoxacillin from last Friday through this past Monday for tooth extraction. Pt states he stopped Abx early due to symptoms.

## 2015-02-17 LAB — BASIC METABOLIC PANEL
Anion gap: 5 (ref 5–15)
BUN: 12 mg/dL (ref 6–20)
CHLORIDE: 105 mmol/L (ref 101–111)
CO2: 26 mmol/L (ref 22–32)
Calcium: 8.3 mg/dL — ABNORMAL LOW (ref 8.9–10.3)
Creatinine, Ser: 1.13 mg/dL (ref 0.61–1.24)
GFR calc non Af Amer: >60 mL/min (ref >60)
Glucose, Bld: 90 mg/dL (ref 65–99)
Potassium: 4.1 mmol/L (ref 3.5–5.1)
SODIUM: 136 mmol/L (ref 135–145)

## 2015-02-17 LAB — CBC
HCT: 42.5 % (ref 39.0–52.0)
Hemoglobin: 13.9 g/dL (ref 13.0–17.0)
MCH: 30.2 pg (ref 26.0–34.0)
MCHC: 32.7 g/dL (ref 30.0–36.0)
MCV: 92.2 fL (ref 78.0–100.0)
Platelets: 121 10*3/uL — ABNORMAL LOW (ref 150–400)
RBC: 4.61 MIL/uL (ref 4.22–5.81)
RDW: 14.1 % (ref 11.5–15.5)
WBC: 10.1 10*3/uL (ref 4.0–10.5)

## 2015-02-17 MED ORDER — CETYLPYRIDINIUM CHLORIDE 0.05 % MT LIQD: 7.0000 mL | Freq: Two times a day (BID) | OROMUCOSAL | Status: DC

## 2015-02-17 MED ORDER — NORTRIPTYLINE HCL 25 MG PO CAPS
75.0000 mg | ORAL_CAPSULE | Freq: Every day | ORAL | Status: DC
  Administered 2015-02-17 – 2015-02-19 (×3): 75 mg via ORAL
  Filled 2015-02-17 (×4): qty 3

## 2015-02-17 MED ORDER — CHLORHEXIDINE GLUCONATE 0.12 % MT SOLN
15.0000 mL | Freq: Two times a day (BID) | OROMUCOSAL | Status: DC
  Administered 2015-02-17 – 2015-02-20 (×6): 15 mL via OROMUCOSAL
  Filled 2015-02-17 (×8): qty 15

## 2015-02-17 NOTE — Progress Notes (Signed)
Subjective:  Stable and alert. No nausea. Voiding OK, No distress. Still has lower abdominal pain WBC 10,100. CT reviewed.  Phlegmon---no abscess. Objective: Vital signs in last 24 hours: Temp:  [97.8 F (36.6 C)-100.1 F (37.8 C)] 97.8 F (36.6 C) (07/07 0535) Pulse Rate:  [85-113] 85 (07/07 0535) Resp:  [16-21] 18 (07/07 0535) BP: (111-139)/(74-94) 111/76 mmHg (07/07 0535) SpO2:  [95 %-99 %] 96 % (07/07 0535) Weight:  [117.935 kg (260 lb)-122.29 kg (269 lb 9.6 oz)] 117.935 kg (260 lb) (07/06 1440)    Intake/Output from previous day: 07/06 0701 - 07/07 0700 In: 300 [I.V.:300] Out: -  Intake/Output this shift: Total I/O In: 300 [I.V.:300] Out: -   General appearance: alert. coop. min. distress Resp: clear bilat. GI: soft. locallized tenderness lower midline. Not distended.  Lab Results:   Recent Labs  02/16/15 1517 02/17/15 0432  WBC 14.3* 10.1  HGB 16.7 13.9  HCT 48.4 42.5  PLT 140* 121*   BMET  Recent Labs  02/16/15 1517 02/17/15 0432  NA 135 136  K 4.0 4.1  CL 104 105  CO2 23 26  GLUCOSE 94 90  BUN 13 12  CREATININE 1.07 1.13  CALCIUM 8.9 8.3*   PT/INR No results for input(s): LABPROT, INR in the last 72 hours. ABG No results for input(s): PHART, HCO3 in the last 72 hours.  Invalid input(s): PCO2, PO2  Studies/Results: Ct Abdomen Pelvis W Contrast  02/16/2015   CLINICAL DATA:  49 year old male with history of nausea, fever, chills and lower abdominal pain for the past 3 days. No diarrhea or emesis.  EXAM: CT ABDOMEN AND PELVIS WITH CONTRAST  TECHNIQUE: Multidetector CT imaging of the abdomen and pelvis was performed using the standard protocol following bolus administration of intravenous contrast.  CONTRAST:  OMNIPAQUE IOHEXOL 300 MG/ML  SOLN  COMPARISON:  No priors.  FINDINGS: Lower chest:  Lipomatous hypertrophy of the interatrial septum.  Hepatobiliary: No cystic or solid hepatic lesions. No intra or extrahepatic biliary ductal  dilatation. 2 mm gallstone lying dependently in the neck of the gallbladder. No current findings to suggest an acute cholecystitis at this time.  Pancreas: No pancreatic mass. No pancreatic ductal dilatation. No pancreatic or peripancreatic fluid or inflammatory changes.  Spleen: Unremarkable.  Adrenals/Urinary Tract: 4 cm well-defined nonenhancing lesion in the upper pole of the right kidney is compatible with an exophytic simple cyst. Left kidney is normal in appearance. Bilateral adrenal glands are normal in appearance. No hydroureteronephrosis. Urinary bladder is normal in appearance.  Stomach/Bowel: In the mid sigmoid colon there are extensive inflammatory changes adjacent to several colonic diverticulitis. Image 76 of series 2 and coronal image 53 of series 5 demonstrate what appears to be a contained perforation within the sigmoid mesocolon adjacent to an inflamed diverticulum. This has gas and a small amount of feculent material, which could represent an early diverticular abscess. This measures approximately 2.3 x 1.3 cm.  Vascular/Lymphatic: Atherosclerosis throughout the abdominal and pelvic vasculature, without evidence of aneurysm. No lymphadenopathy noted in the abdomen or pelvis.  Reproductive: Prostate gland and seminal vesicles are unremarkable in appearance.  Other: Trace volume of ascites. Although there is a small amount of extraluminal gas within the sigmoid mesocolon, this appears be isolated. No pneumoperitoneum is otherwise noted. LapBand in position, with reservoir in the subcutaneous fat overlying the right side of the mid abdomen.  Musculoskeletal: There are no aggressive appearing lytic or blastic lesions noted in the visualized portions of the skeleton.  IMPRESSION:  1. Severe acute diverticulitis of the mid sigmoid colon with a contained perforation in the sigmoid mesocolon, with what appears to be a small developing diverticular abscess, as detailed above. Surgical consultation is  strongly recommended. 2. Cholelithiasis without evidence acute cholecystitis. 3. Additional incidental findings, as above. These results were called by telephone at the time of interpretation on 02/16/2015 at 7:00 pm to Dr. Marlon PelIFFANY GREENE, who verbally acknowledged these results.   Electronically Signed   By: Trudie Reedaniel  Entrikin M.D.   On: 02/16/2015 19:00   Dg Abd Acute W/chest  02/16/2015   CLINICAL DATA:  49 year old male with lower abdominal pain. Initial encounter.  EXAM: DG ABDOMEN ACUTE W/ 1V CHEST  COMPARISON:  02/24/2009.  FINDINGS: Lung volumes are within normal limits. Normal cardiac size and mediastinal contours. No pneumothorax or pneumoperitoneum. No confluent pulmonary opacity.  Large body habitus and quantum mottle artifact. Sequelae of gastric banding again noted. Anatomic detail of the band today is limited. Non obstructed bowel gas pattern. Abdominal and pelvic visceral contours are within normal limits. No acute osseous abnormality identified.  IMPRESSION: 1.  Normal bowel gas pattern, no free air. 2. Sequelae of gastric banding re- identified, but detail of the band is limited on these images. 3.  No acute cardiopulmonary abnormality.   Electronically Signed   By: Odessa FlemingH  Hall M.D.   On: 02/16/2015 15:22    Anti-infectives: Anti-infectives    Start     Dose/Rate Route Frequency Ordered Stop   02/17/15 0400  metroNIDAZOLE (FLAGYL) IVPB 500 mg     500 mg 100 mL/hr over 60 Minutes Intravenous Every 8 hours 02/16/15 2146     02/16/15 2200  ciprofloxacin (CIPRO) IVPB 400 mg     400 mg 200 mL/hr over 60 Minutes Intravenous Every 12 hours 02/16/15 2146     02/16/15 2030  ciprofloxacin (CIPRO) IVPB 400 mg  Status:  Discontinued     400 mg 200 mL/hr over 60 Minutes Intravenous  Once 02/16/15 2015 02/16/15 2154   02/16/15 2030  metroNIDAZOLE (FLAGYL) IVPB 500 mg     500 mg 100 mL/hr over 60 Minutes Intravenous  Once 02/16/15 2015 02/16/15 2133      Assessment/Plan:  Acute diverticulitis  with phlegmon and contained perforation.  No drainable abscess stable Continue antibiotics and bowel rest Treatment plan explained. Mobilize lovenox   LOS: 1 day    Trenace Coughlin M 02/17/2015

## 2015-02-18 LAB — CBC
HEMATOCRIT: 42.1 % (ref 39.0–52.0)
HEMOGLOBIN: 14 g/dL (ref 13.0–17.0)
MCH: 30.8 pg (ref 26.0–34.0)
MCHC: 33.3 g/dL (ref 30.0–36.0)
MCV: 92.5 fL (ref 78.0–100.0)
Platelets: 133 10*3/uL — ABNORMAL LOW (ref 150–400)
RBC: 4.55 MIL/uL (ref 4.22–5.81)
RDW: 13.4 % (ref 11.5–15.5)
WBC: 7.4 10*3/uL (ref 4.0–10.5)

## 2015-02-18 LAB — URINE CULTURE
Colony Count: NO GROWTH
Organism ID, Bacteria: NO GROWTH

## 2015-02-18 MED ORDER — HYDRALAZINE HCL 20 MG/ML IJ SOLN
10.0000 mg | Freq: Four times a day (QID) | INTRAMUSCULAR | Status: DC | PRN
  Administered 2015-02-18: 10 mg via INTRAVENOUS
  Filled 2015-02-18: qty 1

## 2015-02-18 NOTE — Progress Notes (Signed)
Subjective: States that he feels much better and wants to go home. Passing flatus but no stool yet.  Hungry. Voiding uneventfully.  Urine yellow and clear.  Urine culture negative. Afebrile.  Heart rate 79.  WBC 7400.   Objective: Vital signs in last 24 hours: Temp:  [97.5 F (36.4 C)-97.8 F (36.6 C)] 97.5 F (36.4 C) (07/08 0537) Pulse Rate:  [77-86] 79 (07/08 0537) Resp:  [18] 18 (07/08 0537) BP: (136-144)/(76-92) 138/81 mmHg (07/08 0537) SpO2:  [95 %-97 %] 97 % (07/08 0537)    Intake/Output from previous day: 07/07 0701 - 07/08 0700 In: 3500 [I.V.:3500] Out: 2175 [Urine:2175] Intake/Output this shift: Total I/O In: 1500 [I.V.:1500] Out: 1250 [Urine:1250]  General appearance: Alert.  Cooperative.  Mental status normal.  In no distress. GI: Soft.  Nondistended.  Bowel sounds present.  Mild tenderness across lower abdomen.  No guarding.  No mass.  Lab Results:   Recent Labs  02/17/15 0432 02/18/15 0400  WBC 10.1 7.4  HGB 13.9 14.0  HCT 42.5 42.1  PLT 121* 133*   BMET  Recent Labs  02/16/15 1517 02/17/15 0432  NA 135 136  K 4.0 4.1  CL 104 105  CO2 23 26  GLUCOSE 94 90  BUN 13 12  CREATININE 1.07 1.13  CALCIUM 8.9 8.3*   PT/INR No results for input(s): LABPROT, INR in the last 72 hours. ABG No results for input(s): PHART, HCO3 in the last 72 hours.  Invalid input(s): PCO2, PO2  Studies/Results: Ct Abdomen Pelvis W Contrast  02/16/2015   CLINICAL DATA:  49 year old male with history of nausea, fever, chills and lower abdominal pain for the past 3 days. No diarrhea or emesis.  EXAM: CT ABDOMEN AND PELVIS WITH CONTRAST  TECHNIQUE: Multidetector CT imaging of the abdomen and pelvis was performed using the standard protocol following bolus administration of intravenous contrast.  CONTRAST:  OMNIPAQUE IOHEXOL 300 MG/ML  SOLN  COMPARISON:  No priors.  FINDINGS: Lower chest:  Lipomatous hypertrophy of the interatrial septum.  Hepatobiliary: No cystic  or solid hepatic lesions. No intra or extrahepatic biliary ductal dilatation. 2 mm gallstone lying dependently in the neck of the gallbladder. No current findings to suggest an acute cholecystitis at this time.  Pancreas: No pancreatic mass. No pancreatic ductal dilatation. No pancreatic or peripancreatic fluid or inflammatory changes.  Spleen: Unremarkable.  Adrenals/Urinary Tract: 4 cm well-defined nonenhancing lesion in the upper pole of the right kidney is compatible with an exophytic simple cyst. Left kidney is normal in appearance. Bilateral adrenal glands are normal in appearance. No hydroureteronephrosis. Urinary bladder is normal in appearance.  Stomach/Bowel: In the mid sigmoid colon there are extensive inflammatory changes adjacent to several colonic diverticulitis. Image 76 of series 2 and coronal image 53 of series 5 demonstrate what appears to be a contained perforation within the sigmoid mesocolon adjacent to an inflamed diverticulum. This has gas and a small amount of feculent material, which could represent an early diverticular abscess. This measures approximately 2.3 x 1.3 cm.  Vascular/Lymphatic: Atherosclerosis throughout the abdominal and pelvic vasculature, without evidence of aneurysm. No lymphadenopathy noted in the abdomen or pelvis.  Reproductive: Prostate gland and seminal vesicles are unremarkable in appearance.  Other: Trace volume of ascites. Although there is a small amount of extraluminal gas within the sigmoid mesocolon, this appears be isolated. No pneumoperitoneum is otherwise noted. LapBand in position, with reservoir in the subcutaneous fat overlying the right side of the mid abdomen.  Musculoskeletal: There are  no aggressive appearing lytic or blastic lesions noted in the visualized portions of the skeleton.  IMPRESSION: 1. Severe acute diverticulitis of the mid sigmoid colon with a contained perforation in the sigmoid mesocolon, with what appears to be a small developing  diverticular abscess, as detailed above. Surgical consultation is strongly recommended. 2. Cholelithiasis without evidence acute cholecystitis. 3. Additional incidental findings, as above. These results were called by telephone at the time of interpretation on 02/16/2015 at 7:00 pm to Dr. Marlon PelIFFANY GREENE, who verbally acknowledged these results.   Electronically Signed   By: Trudie Reedaniel  Entrikin M.D.   On: 02/16/2015 19:00   Dg Abd Acute W/chest  02/16/2015   CLINICAL DATA:  49 year old male with lower abdominal pain. Initial encounter.  EXAM: DG ABDOMEN ACUTE W/ 1V CHEST  COMPARISON:  02/24/2009.  FINDINGS: Lung volumes are within normal limits. Normal cardiac size and mediastinal contours. No pneumothorax or pneumoperitoneum. No confluent pulmonary opacity.  Large body habitus and quantum mottle artifact. Sequelae of gastric banding again noted. Anatomic detail of the band today is limited. Non obstructed bowel gas pattern. Abdominal and pelvic visceral contours are within normal limits. No acute osseous abnormality identified.  IMPRESSION: 1.  Normal bowel gas pattern, no free air. 2. Sequelae of gastric banding re- identified, but detail of the band is limited on these images. 3.  No acute cardiopulmonary abnormality.   Electronically Signed   By: Odessa FlemingH  Hall M.D.   On: 02/16/2015 15:22    Anti-infectives: Anti-infectives    Start     Dose/Rate Route Frequency Ordered Stop   02/17/15 0400  metroNIDAZOLE (FLAGYL) IVPB 500 mg     500 mg 100 mL/hr over 60 Minutes Intravenous Every 8 hours 02/16/15 2146     02/16/15 2200  ciprofloxacin (CIPRO) IVPB 400 mg     400 mg 200 mL/hr over 60 Minutes Intravenous Every 12 hours 02/16/15 2146     02/16/15 2030  ciprofloxacin (CIPRO) IVPB 400 mg  Status:  Discontinued     400 mg 200 mL/hr over 60 Minutes Intravenous  Once 02/16/15 2015 02/16/15 2154   02/16/15 2030  metroNIDAZOLE (FLAGYL) IVPB 500 mg     500 mg 100 mL/hr over 60 Minutes Intravenous  Once 02/16/15 2015  02/16/15 2133      Assessment/Plan:  Acute diverticulitis with significant phlegmon and contained perforation.  No peritonitis.  No drainable abscess.   Responding to medical management  Continue IV Cipro and IV Flagyl  Begin liquid diet  Hopefully he will progress and be ready for discharge Sunday or Monday  Plan 14 day course of antibiotic total Follow-up in office in 3 weeks or so.   Refer to GI for colonoscopy in about 2 months.     LOS: 2 days    Devere Brem M 02/18/2015

## 2015-02-19 LAB — CBC
HCT: 41.3 % (ref 39.0–52.0)
HEMOGLOBIN: 14 g/dL (ref 13.0–17.0)
MCH: 30.6 pg (ref 26.0–34.0)
MCHC: 33.9 g/dL (ref 30.0–36.0)
MCV: 90.4 fL (ref 78.0–100.0)
PLATELETS: 147 10*3/uL — AB (ref 150–400)
RBC: 4.57 MIL/uL (ref 4.22–5.81)
RDW: 13.2 % (ref 11.5–15.5)
WBC: 5.8 10*3/uL (ref 4.0–10.5)

## 2015-02-19 NOTE — Progress Notes (Signed)
Dr. Michaell CowingGross aware via phone pt request diet be advanced if possible. Tolerated fulls x 2 meals and had 2 BMs today. Denies pain. See new order received for soft diet.

## 2015-02-19 NOTE — Progress Notes (Signed)
  Subjective: Still feeling better Passing flatus but no stool yet.     Objective: Vital signs in last 24 hours: Temp:  [97.5 F (36.4 C)-98.1 F (36.7 C)] 97.8 F (36.6 C) (07/09 [id number]) Pulse Rate:  [84-89] 84 (07/09 0613) Resp:  [12-17] 17 (07/09 0613) BP: (137-179)/(64-110) 137/92 mmHg (07/09 0613) SpO2:  [97 %-99 %] 97 % (07/09 0613)    Intake/Output from previous day: 07/08 0701 - 07/09 0700 In: 2780.8 [P.O.:360; I.V.:2020.8; IV Piggyback:400] Out: 3550 [Urine:3550] Intake/Output this shift:    General appearance: Alert.  Cooperative.  Mental status normal.  In no distress. GI: Soft.  Nondistended.  Bowel sounds present.  Mild tenderness across lower abdomen.  No guarding.  No mass.  Lab Results:   Recent Labs  02/18/15 0400 02/19/15 0523  WBC 7.4 5.8  HGB 14.0 14.0  HCT 42.1 41.3  PLT 133* 147*   BMET  Recent Labs  02/16/15 1517 02/17/15 0432  NA 135 136  K 4.0 4.1  CL 104 105  CO2 23 26  GLUCOSE 94 90  BUN 13 12  CREATININE 1.07 1.13  CALCIUM 8.9 8.3*   PT/INR No results for input(s): LABPROT, INR in the last 72 hours. ABG No results for input(s): PHART, HCO3 in the last 72 hours.  Invalid input(s): PCO2, PO2  Studies/Results: No results found.  Anti-infectives: Anti-infectives    Start     Dose/Rate Route Frequency Ordered Stop   02/17/15 0400  metroNIDAZOLE (FLAGYL) IVPB 500 mg     500 mg 100 mL/hr over 60 Minutes Intravenous Every 8 hours 02/16/15 2146     02/16/15 2200  ciprofloxacin (CIPRO) IVPB 400 mg     400 mg 200 mL/hr over 60 Minutes Intravenous Every 12 hours 02/16/15 2146     02/16/15 2030  ciprofloxacin (CIPRO) IVPB 400 mg  Status:  Discontinued     400 mg 200 mL/hr over 60 Minutes Intravenous  Once 02/16/15 2015 02/16/15 2154   02/16/15 2030  metroNIDAZOLE (FLAGYL) IVPB 500 mg     500 mg 100 mL/hr over 60 Minutes Intravenous  Once 02/16/15 2015 02/16/15 2133      Assessment/Plan:  Acute diverticulitis with  significant phlegmon and contained perforation.  No peritonitis.  No drainable abscess.   Responding to medical management  Continue IV Cipro and IV Flagyl  Advance liquid diet and await bowel function Hopefully he will progress and be ready for discharge Sunday or Monday  Plan 14 day course of antibiotic total Follow-up in office in 3 weeks or so.   Refer to GI for colonoscopy in about 2 months.     LOS: 3 days    Cory Jenkins C. 02/19/2015

## 2015-02-20 MED ORDER — METRONIDAZOLE 500 MG PO TABS: 500.0000 mg | ORAL_TABLET | Freq: Three times a day (TID) | ORAL | Status: DC

## 2015-02-20 MED ORDER — CIPROFLOXACIN HCL 500 MG PO TABS: 500.0000 mg | ORAL_TABLET | Freq: Two times a day (BID) | ORAL | Status: DC

## 2015-02-20 MED ORDER — CIPROFLOXACIN HCL 500 MG PO TABS
500.0000 mg | ORAL_TABLET | Freq: Two times a day (BID) | ORAL | Status: DC
  Administered 2015-02-20: 500 mg via ORAL
  Filled 2015-02-20 (×3): qty 1

## 2015-02-20 MED ORDER — METRONIDAZOLE 500 MG PO TABS
500.0000 mg | ORAL_TABLET | Freq: Four times a day (QID) | ORAL | Status: DC
  Administered 2015-02-20: 500 mg via ORAL
  Filled 2015-02-20 (×5): qty 1

## 2015-02-20 NOTE — Progress Notes (Signed)
Assessment unchanged. Pt verbalized understanding of dc instructions through teach back. Script x 2 given as provided by MD. Information provided regarding diet along with AVS. Discharged via foot per request to meet awaiting vehicle at front entrance. Accompanied by NT and wife.

## 2015-02-20 NOTE — Discharge Summary (Signed)
  Patient ID: Cory Jenkins [id number] 49 y.o. 11-Dec-1965  02/16/2015  Discharge date and time: 02/20/15  Admitting Physician: Vanita PandaHOMAS, Mahogani Holohan C.  Discharge Physician: Vanita PandaHOMAS, Jveon Pound C.  Admission Diagnoses: Lower abdominal pain [R10.30] Diverticulitis of intestine with perforation and abscess without bleeding [K57.80]  Discharge Diagnoses: same   Discharged Condition: good    Hospital Course: Patient was admitted with lower abd pain and diverticulitis with microperforation.  He was placed on IV antibiotics and made NPO.  Once his symptoms were resolved, his diet was advanced.  He began to have bowel function and had no pain with this.  He remained afebrile and his wbc was normal during this.  I did not feel it was necessary to repeat his CT since he was asymptomatic.  His blood pressure was elevated here in the hospital toward the end of the stay.  He is 1.5L positive.  He does not have hypertension at home.  I think this is due to extra fluid and will resolve with time.  He will keep a close eye on this at home.  His father is a cardiologist and, per patient, is willing to help him with this as well.  He will follow up with Dr Derrell LollingIngram in the office and will need a colonoscopy in 2 months.    Consults: None  Significant Diagnostic Studies: labs: cbc, chemistry   Treatments: IV hydration and antibiotics: Cipro and metronidazole  Disposition: Home

## 2015-02-20 NOTE — Discharge Instructions (Signed)

## 2015-02-21 LAB — CULTURE, BLOOD (ROUTINE X 2)
CULTURE: NO GROWTH
CULTURE: NO GROWTH

## 2015-03-04 ENCOUNTER — Other Ambulatory Visit: Payer: Self-pay | Admitting: Emergency Medicine

## 2015-03-04 ENCOUNTER — Telehealth: Payer: Self-pay | Admitting: Emergency Medicine

## 2015-03-04 MED ORDER — CIPROFLOXACIN HCL 500 MG PO TABS: 500.0000 mg | ORAL_TABLET | Freq: Two times a day (BID) | ORAL | Status: DC

## 2015-03-04 NOTE — Telephone Encounter (Signed)
Patient has phlebitis of his forearm.Cipro refilled. Check in office on Sat.

## 2015-03-04 NOTE — Telephone Encounter (Signed)
Wife notified.

## 2015-03-05 ENCOUNTER — Ambulatory Visit (INDEPENDENT_AMBULATORY_CARE_PROVIDER_SITE_OTHER): Payer: BLUE CROSS/BLUE SHIELD | Admitting: Emergency Medicine

## 2015-03-05 VITALS — BP 114/86 | HR 96 | Temp 98.4°F | Resp 18 | Ht 72.0 in | Wt 262.6 lb

## 2015-03-05 DIAGNOSIS — K5732 Diverticulitis of large intestine without perforation or abscess without bleeding: Secondary | ICD-10-CM

## 2015-03-05 DIAGNOSIS — I809 Phlebitis and thrombophlebitis of unspecified site: Secondary | ICD-10-CM

## 2015-03-05 DIAGNOSIS — R1030 Lower abdominal pain, unspecified: Secondary | ICD-10-CM | POA: Diagnosis not present

## 2015-03-05 DIAGNOSIS — B370 Candidal stomatitis: Secondary | ICD-10-CM

## 2015-03-05 MED ORDER — CLOTRIMAZOLE 10 MG MT TROC: OROMUCOSAL | Status: DC

## 2015-03-05 NOTE — Progress Notes (Addendum)
Subjective:  This chart was scribed for Lesle Chris, MD by Broadus John, Medical Scribe. This patient was seen in Room 12 and the patient's care was started at 12:07 PM.   Patient ID: Cory Jenkins, male    DOB: 31-May-1966, 49 y.o.   MRN: [id number]  Chief Complaint  Patient presents with  . post hospital injury    Where I.V was placed on left arm, some redness and inflammation    HPI HPI Comments: Cory Jenkins is a 49 y.o. male with a medical history of Diverticulitis of sigmoid colon who presents to [facility] for a follow up regarding a post hospital injury.  Pt was hospitalized with diverticulitis and was treated with IV antibiotics, he was released from the hospital 4 days ago. Pt has his follow up appointment with Dr. Derrell Lolling in 6 days. Pt is compliant with his Cipro medication. Today pt presents with a possible clot in his left forearm, onset yesterday. Pt notes that his condition has much improved since yesterday. He denies any abdominal pain; he notes that the pain has resolved since 2 days ago. Pt is still on a low fiber diet which ends tomorrow, and he notes that he will switch to a high fiber diet for the next two weeks.  Pt does not present with any further concerns.    Patient Active Problem List   Diagnosis Date Noted  . Hx of laparoscopic gastric banding 02/16/2015  . Diverticulitis of sigmoid colon 02/16/2015   Past Medical History  Diagnosis Date  . Depression    Past Surgical History  Procedure Laterality Date  . Laparoscopic gastric banding    . Orchiectomy     Allergies  Allergen Reactions  . Keflex [Cephalexin] Other (See Comments)    Abdominal pain  . Sulfa Antibiotics Hives   Prior to Admission medications   Medication Sig Start Date End Date Taking? Authorizing Provider  ciprofloxacin (CIPRO) 500 MG tablet Take 1 tablet (500 mg total) by mouth 2 (two) times daily. 03/04/15  Yes Collene Gobble, MD  nortriptyline (PAMELOR) 25  MG capsule Take 75 mg by mouth daily.    Yes Historical Provider, MD  zolpidem (AMBIEN) 10 MG tablet Take 10 mg by mouth at bedtime as needed for sleep.   Yes Historical Provider, MD  metroNIDAZOLE (FLAGYL) 500 MG tablet Take 1 tablet (500 mg total) by mouth 3 (three) times daily. Patient not taking: Reported on 03/05/2015 02/20/15   Romie Levee, MD  oxyCODONE-acetaminophen (PERCOCET/ROXICET) 5-325 MG per tablet Take 1-2 tablets by mouth every 4 (four) hours as needed for moderate pain or severe pain.  02/10/15   Historical Provider, MD   History   Social History  . Marital Status: Married    Spouse Name: N/A  . Number of Children: N/A  . Years of Education: N/A   Occupational History  . Not on file.   Social History Main Topics  . Smoking status: Light Tobacco Smoker  . Smokeless tobacco: Never Used  . Alcohol Use: No  . Drug Use: No  . Sexual Activity: Not on file   Other Topics Concern  . Not on file   Social History Narrative    Review of Systems  Gastrointestinal: Negative for abdominal pain.      Objective:   Physical Exam  Constitutional: He is oriented to person, place, and time. He appears well-developed and well-nourished. No distress.  HENT:  Head: Normocephalic and atraumatic.  Eyes: EOM are normal. Pupils are equal, round, and reactive to light.  Neck: Neck supple.  Cardiovascular: Normal rate.   Pulmonary/Chest: Effort normal.  Neurological: He is alert and oriented to person, place, and time. No cranial nerve deficit.  Skin: Skin is warm and dry.  Psychiatric: He has a normal mood and affect. His behavior is normal.  Nursing note and vitals reviewed.  BP 114/86 mmHg  Pulse 96  Temp(Src) 98.4 F (36.9 C) (Oral)  Resp 18  Ht 6' (1.829 m)  Wt 262 lb 9.6 oz (119.115 kg)  BMI 35.61 kg/m2  SpO2 97%     Assessment & Plan:  1. Diverticulitis of colon Patiet responded to IV Cipro and Flagyl. He finished his antibody aches earlier this week. He is due  to see the surgeon in one week for follow-up 2. Phlebitis  Patient has a superficial phlebitis of the left forearm. It is improved since yesterday. He started on Cipro yesterday. There is no evidence of subclavian vein involvement good emptying of the veins on the top of the hand. The redness experienced yesterday is improved   3. Lower abdominal pain No abdominal pain on exam today  4. Thrush  - clotrimazole (MYCELEX) 10 MG troche; Use 1 as a lozenger 5 times a day  Dispense: 25 tablet; Refill: 0   I personally performed the services described in this documentation, which was scribed in my presence. The recorded information has been reviewed and is accurate.  Lesle Chris, MD  [facility], [facility]  03/05/2015 12:21 PM

## 2015-05-31 ENCOUNTER — Other Ambulatory Visit: Payer: Self-pay | Admitting: Gastroenterology

## 2015-06-01 ENCOUNTER — Ambulatory Visit (INDEPENDENT_AMBULATORY_CARE_PROVIDER_SITE_OTHER): Payer: BLUE CROSS/BLUE SHIELD | Admitting: Emergency Medicine

## 2015-06-01 VITALS — BP 128/84 | HR 88 | Temp 98.0°F | Resp 18 | Ht 72.0 in | Wt 267.8 lb

## 2015-06-01 DIAGNOSIS — I10 Essential (primary) hypertension: Secondary | ICD-10-CM | POA: Diagnosis not present

## 2015-06-01 LAB — POCT CBC
Granulocyte percent: 67.6 %G (ref 37–80)
HCT, POC: 51 % (ref 43.5–53.7)
HEMOGLOBIN: 17.8 g/dL (ref 14.1–18.1)
Lymph, poc: 2.1 (ref 0.6–3.4)
MCH, POC: 31.1 pg (ref 27–31.2)
MCHC: 34.9 g/dL (ref 31.8–35.4)
MCV: 89.1 fL (ref 80–97)
MID (cbc): 0.7 (ref 0–0.9)
MPV: 9.5 fL (ref 0–99.8)
POC Granulocyte: 6 (ref 2–6.9)
POC LYMPH PERCENT: 24 %L (ref 10–50)
POC MID %: 8.4 %M (ref 0–12)
Platelet Count, POC: 150 10*3/uL (ref 142–424)
RBC: 5.72 M/uL (ref 4.69–6.13)
RDW, POC: 13.8 %
WBC: 8.9 10*3/uL (ref 4.6–10.2)

## 2015-06-01 LAB — BASIC METABOLIC PANEL WITH GFR
BUN: 10 mg/dL (ref 7–25)
CALCIUM: 9.3 mg/dL (ref 8.6–10.3)
CO2: 27 mmol/L (ref 20–31)
CREATININE: 1.05 mg/dL (ref 0.60–1.35)
Chloride: 103 mmol/L (ref 98–110)
GFR, Est Non African American: 83 mL/min (ref >=60)
GLUCOSE: 91 mg/dL (ref 65–99)
POTASSIUM: 4.3 mmol/L (ref 3.5–5.3)
Sodium: 136 mmol/L (ref 135–146)

## 2015-06-01 LAB — MAGNESIUM: MAGNESIUM: 2.2 mg/dL (ref 1.5–2.5)

## 2015-06-01 MED ORDER — LISINOPRIL 20 MG PO TABS: 20.0000 mg | ORAL_TABLET | Freq: Every day | ORAL | Status: DC

## 2015-06-01 NOTE — Patient Instructions (Addendum)
I would like to see you in 3 weeks for recheck.    Marland Kitchen.DASH Eating Plan DASH stands for "Dietary Approaches to Stop Hypertension." The DASH eating plan is a healthy eating plan that has been shown to reduce high blood pressure (hypertension). Additional health benefits may include reducing the risk of type 2 diabetes mellitus, heart disease, and stroke. The DASH eating plan may also help with weight loss. WHAT DO I NEED TO KNOW ABOUT THE DASH EATING PLAN? For the DASH eating plan, you will follow these general guidelines:  Choose foods with a percent daily value for sodium of less than 5% (as listed on the food label).  Use salt-free seasonings or herbs instead of table salt or sea salt.  Check with your health care provider or pharmacist before using salt substitutes.  Eat lower-sodium products, often labeled as "lower sodium" or "no salt added."  Eat fresh foods.  Eat more vegetables, fruits, and low-fat dairy products.  Choose whole grains. Look for the word "whole" as the first word in the ingredient list.  Choose fish and skinless chicken or [place] more often than red meat. Limit fish, poultry, and meat to 6 oz (170 g) each day.  Limit sweets, desserts, sugars, and sugary drinks.  Choose heart-healthy fats.  Limit cheese to 1 oz (28 g) per day.  Eat more home-cooked food and less restaurant, buffet, and fast food.  Limit fried foods.  Cook foods using methods other than frying.  Limit canned vegetables. If you do use them, rinse them well to decrease the sodium.  When eating at a restaurant, ask that your food be prepared with less salt, or no salt if possible. WHAT FOODS CAN I EAT? Seek help from a dietitian for individual calorie needs. Grains Whole grain or whole wheat bread. Brown rice. Whole grain or whole wheat pasta. Quinoa, bulgur, and whole grain cereals. Low-sodium cereals. Corn or whole wheat flour tortillas. Whole grain cornbread. Whole grain crackers.  Low-sodium crackers. Vegetables Fresh or frozen vegetables (raw, steamed, roasted, or grilled). Low-sodium or reduced-sodium tomato and vegetable juices. Low-sodium or reduced-sodium tomato sauce and paste. Low-sodium or reduced-sodium canned vegetables.  Fruits All fresh, canned (in natural juice), or frozen fruits. Meat and Other Protein Products Ground beef (85% or leaner), grass-fed beef, or beef trimmed of fat. Skinless chicken or [place]. Ground chicken or [place]key. Pork trimmed of fat. All fish and seafood. Eggs. Dried beans, peas, or lentils. Unsalted nuts and seeds. Unsalted canned beans. Dairy Low-fat dairy products, such as skim or 1% milk, 2% or reduced-fat cheeses, low-fat ricotta or cottage cheese, or plain low-fat yogurt. Low-sodium or reduced-sodium cheeses. Fats and Oils Tub margarines without trans fats. Light or reduced-fat mayonnaise and salad dressings (reduced sodium). Avocado. Safflower, olive, or canola oils. Natural peanut or almond butter. Other Unsalted popcorn and pretzels. The items listed above may not be a complete list of recommended foods or beverages. Contact your dietitian for more options. WHAT FOODS ARE NOT RECOMMENDED? Grains White bread. White pasta. White rice. Refined cornbread. Bagels and croissants. Crackers that contain trans fat. Vegetables Creamed or fried vegetables. Vegetables in a cheese sauce. Regular canned vegetables. Regular canned tomato sauce and paste. Regular tomato and vegetable juices. Fruits Dried fruits. Canned fruit in light or heavy syrup. Fruit juice. Meat and Other Protein Products Fatty cuts of meat. Ribs, chicken wings, bacon, sausage, bologna, salami, chitterlings, fatback, hot dogs, bratwurst, and packaged luncheon meats. Salted nuts and seeds. Canned beans with salt. Dairy  Whole or 2% milk, cream, half-and-half, and cream cheese. Whole-fat or sweetened yogurt. Full-fat cheeses or blue cheese. Nondairy creamers and whipped  toppings. Processed cheese, cheese spreads, or cheese curds. Condiments Onion and garlic salt, seasoned salt, table salt, and sea salt. Canned and packaged gravies. Worcestershire sauce. Tartar sauce. Barbecue sauce. Teriyaki sauce. Soy sauce, including reduced sodium. Steak sauce. Fish sauce. Oyster sauce. Cocktail sauce. Horseradish. Ketchup and mustard. Meat flavorings and tenderizers. Bouillon cubes. Hot sauce. Tabasco sauce. Marinades. Taco seasonings. Relishes. Fats and Oils Butter, stick margarine, lard, shortening, ghee, and bacon fat. Coconut, palm kernel, or palm oils. Regular salad dressings. Other Pickles and olives. Salted popcorn and pretzels. The items listed above may not be a complete list of foods and beverages to avoid. Contact your dietitian for more information. WHERE CAN I FIND MORE INFORMATION? National Heart, Lung, and Blood Institute: [employer]   This information is not intended to replace advice given to you by your health care provider. Make sure you discuss any questions you have with your health care provider.   Document Released: 07/19/2011 Document Revised: 08/20/2014 Document Reviewed: 06/03/2013 Elsevier Interactive Patient Education 2016 [employer]. Health Maintenance, Male A healthy lifestyle and preventative care can promote health and wellness.  Maintain regular health, dental, and eye exams.  Eat a healthy diet. Foods like vegetables, fruits, whole grains, low-fat dairy products, and lean protein foods contain the nutrients you need and are low in calories. Decrease your intake of foods high in solid fats, added sugars, and salt. Get information about a proper diet from your health care provider, if necessary.  Regular physical exercise is one of the most important things you can do for your health. Most adults should get at least 150 minutes of moderate-intensity exercise (any activity that increases your heart  rate and causes you to sweat) each week. In addition, most adults need muscle-strengthening exercises on 2 or more days a week.   Maintain a healthy weight. The body mass index (BMI) is a screening tool to identify possible weight problems. It provides an estimate of body fat based on height and weight. Your health care provider can find your BMI and can help you achieve or maintain a healthy weight. For males 20 years and older:  A BMI below 18.5 is considered underweight.  A BMI of 18.5 to 24.9 is normal.  A BMI of 25 to 29.9 is considered overweight.  A BMI of 30 and above is considered obese.  Maintain normal blood lipids and cholesterol by exercising and minimizing your intake of saturated fat. Eat a balanced diet with plenty of fruits and vegetables. Blood tests for lipids and cholesterol should begin at age 35 and be repeated every 5 years. If your lipid or cholesterol levels are high, you are over age 15, or you are at high risk for heart disease, you may need your cholesterol levels checked more frequently.Ongoing high lipid and cholesterol levels should be treated with medicines if diet and exercise are not working.  If you smoke, find out from your health care provider how to quit. If you do not use tobacco, do not start.  Lung cancer screening is recommended for adults aged 55-80 years who are at high risk for developing lung cancer because of a history of smoking. A yearly low-dose CT scan of the lungs is recommended for people who have at least a 30-pack-year history of smoking and are current smokers or have quit within the past 15 years. A pack  year of smoking is smoking an average of 1 pack of cigarettes a day for 1 year (for example, a 30-pack-year history of smoking could mean smoking 1 pack a day for 30 years or 2 packs a day for 15 years). Yearly screening should continue until the smoker has stopped smoking for at least 15 years. Yearly screening should be stopped for people  who develop a health problem that would prevent them from having lung cancer treatment.  If you choose to drink alcohol, do not have more than 2 drinks per day. One drink is considered to be 12 oz (360 mL) of beer, 5 oz (150 mL) of wine, or 1.5 oz (45 mL) of liquor.  Avoid the use of street drugs. Do not share needles with anyone. Ask for help if you need support or instructions about stopping the use of drugs.  High blood pressure causes heart disease and increases the risk of stroke. High blood pressure is more likely to develop in:  People who have blood pressure in the end of the normal range (100-139/85-89 mm Hg).  People who are overweight or obese.  People who are African American.  If you are 42-63 years of age, have your blood pressure checked every 3-5 years. If you are 47 years of age or older, have your blood pressure checked every year. You should have your blood pressure measured twice--once when you are at a hospital or clinic, and once when you are not at a hospital or clinic. Record the average of the two measurements. To check your blood pressure when you are not at a hospital or clinic, you can use:  An automated blood pressure machine at a pharmacy.  A home blood pressure monitor.  If you are 44-8 years old, ask your health care provider if you should take aspirin to prevent heart disease.  Diabetes screening involves taking a blood sample to check your fasting blood sugar level. This should be done once every 3 years after age 49 if you are at a normal weight and without risk factors for diabetes. Testing should be considered at a younger age or be carried out more frequently if you are overweight and have at least 1 risk factor for diabetes.  Colorectal cancer can be detected and often prevented. Most routine colorectal cancer screening begins at the age of 59 and continues through age 77. However, your health care provider may recommend screening at an earlier age if  you have risk factors for colon cancer. On a yearly basis, your health care provider may provide home test kits to check for hidden blood in the stool. A small camera at the end of a tube may be used to directly examine the colon (sigmoidoscopy or colonoscopy) to detect the earliest forms of colorectal cancer. Talk to your health care provider about this at age 42 when routine screening begins. A direct exam of the colon should be repeated every 5-10 years through age 81, unless early forms of precancerous polyps or small growths are found.  People who are at an increased risk for hepatitis B should be screened for this virus. You are considered at high risk for hepatitis B if:  You were born in a country where hepatitis B occurs often. Talk with your health care provider about which countries are considered high risk.  Your parents were born in a high-risk country and you have not received a shot to protect against hepatitis B (hepatitis B vaccine).  You have HIV  or AIDS.  You use needles to inject street drugs.  You live with, or have sex with, someone who has hepatitis B.  You are a man who has sex with other men (MSM).  You get hemodialysis treatment.  You take certain medicines for conditions like cancer, organ transplantation, and autoimmune conditions.  Hepatitis C blood testing is recommended for all people born from [birth year] through 1965 and any individual with known risk factors for hepatitis C.  Healthy men should no longer receive prostate-specific antigen (PSA) blood tests as part of routine cancer screening. Talk to your health care provider about prostate cancer screening.  Testicular cancer screening is not recommended for adolescents or adult males who have no symptoms. Screening includes self-exam, a health care provider exam, and other screening tests. Consult with your health care provider about any symptoms you have or any concerns you have about testicular  cancer.  Practice safe sex. Use condoms and avoid high-risk sexual practices to reduce the spread of sexually transmitted infections (STIs).  You should be screened for STIs, including gonorrhea and chlamydia if:  You are sexually active and are younger than 24 years.  You are older than 24 years, and your health care provider tells you that you are at risk for this type of infection.  Your sexual activity has changed since you were last screened, and you are at an increased risk for chlamydia or gonorrhea. Ask your health care provider if you are at risk.  If you are at risk of being infected with HIV, it is recommended that you take a prescription medicine daily to prevent HIV infection. This is called pre-exposure prophylaxis (PrEP). You are considered at risk if:  You are a man who has sex with other men (MSM).  You are a heterosexual man who is sexually active with multiple partners.  You take drugs by injection.  You are sexually active with a partner who has HIV.  Talk with your health care provider about whether you are at high risk of being infected with HIV. If you choose to begin PrEP, you should first be tested for HIV. You should then be tested every 3 months for as long as you are taking PrEP.  Use sunscreen. Apply sunscreen liberally and repeatedly throughout the day. You should seek shade when your shadow is shorter than you. Protect yourself by wearing long sleeves, pants, a wide-brimmed hat, and sunglasses year round whenever you are outdoors.  Tell your health care provider of new moles or changes in moles, especially if there is a change in shape or color. Also, tell your health care provider if a mole is larger than the size of a pencil eraser.  A one-time screening for abdominal aortic aneurysm (AAA) and surgical repair of large AAAs by ultrasound is recommended for men aged 65-75 years who are current or former smokers.  Stay current with your vaccines  (immunizations).   This information is not intended to replace advice given to you by your health care provider. Make sure you discuss any questions you have with your health care provider.   Document Released: 01/26/2008 Document Revised: 08/20/2014 Document Reviewed: 12/25/2010 Elsevier Interactive Patient Education [employer]

## 2015-06-01 NOTE — Progress Notes (Addendum)
This chart was scribed for Lesle Chris, MD by Stann Ore, Medical Scribe. This patient was seen in Room 11 and the patient's care was started 10:36 AM.  Chief Complaint:  Chief Complaint  Patient presents with  . Hypertension    Pt. had two BP readings yesterday.     HPI: Cory Jenkins is a 49 y.o. male who reports to [facility] today with BP issues.  He had his colonoscopy done and his BP measured at around 180/129 to 201/130 even while sedated. He denied taking any other medications; only thing he had was the fluid he had to drink for colonoscopy. He was previously on lisinopril. He does note snoring at night. He denies sleep apnea, appetite change, activity change. He smokes less than half a pack a day. He denies going to the gym.   He was trying to lose weight and get it down, but recently it's been creeping back up.   Wt Readings from Last 3 Encounters:  06/01/15 267 lb 12.8 oz (121.473 kg)  03/05/15 262 lb 9.6 oz (119.115 kg)  02/16/15 260 lb (117.935 kg)   His BP today in triage is 128/84.  BP recheck in room, sitting (left arm): 160/100 BP recheck in room, sitting (right arm): 160/100  Patient's father is a cardiologist. He was called in the room to discuss the pt's issue.    Past Medical History  Diagnosis Date  . Depression    Past Surgical History  Procedure Laterality Date  . Laparoscopic gastric banding    . Orchiectomy     Social History   Social History  . Marital Status: Married    Spouse Name: N/A  . Number of Children: N/A  . Years of Education: N/A   Social History Main Topics  . Smoking status: Light Tobacco Smoker  . Smokeless tobacco: Never Used  . Alcohol Use: No  . Drug Use: No  . Sexual Activity: Not Asked   Other Topics Concern  . None   Social History Narrative   History reviewed. No pertinent family history. Allergies  Allergen Reactions  . Keflex [Cephalexin] Other (See Comments)    Abdominal pain  . Sulfa Antibiotics Hives    Prior to Admission medications   Medication Sig Start Date End Date Taking? Authorizing Provider  nortriptyline (PAMELOR) 25 MG capsule Take 75 mg by mouth daily.    Yes Historical Provider, MD  zolpidem (AMBIEN) 10 MG tablet Take 10 mg by mouth at bedtime as needed for sleep.   Yes Historical Provider, MD  ciprofloxacin (CIPRO) 500 MG tablet Take 1 tablet (500 mg total) by mouth 2 (two) times daily. Patient not taking: Reported on 06/01/2015 03/04/15   Collene Gobble, MD  clotrimazole [facility]) 10 MG troche Use 1 as a lozenger 5 times a day Patient not taking: Reported on 06/01/2015 03/05/15   Collene Gobble, MD  metroNIDAZOLE (FLAGYL) 500 MG tablet Take 1 tablet (500 mg total) by mouth 3 (three) times daily. Patient not taking: Reported on 03/05/2015 02/20/15   Romie Levee, MD  oxyCODONE-acetaminophen (PERCOCET/ROXICET) 5-325 MG per tablet Take 1-2 tablets by mouth every 4 (four) hours as needed for moderate pain or severe pain.  02/10/15   Historical Provider, MD     ROS:  Constitutional: negative for chills, fever, night sweats, weight changes, activity changes or fatigue HEENT: negative for vision changes, hearing loss, congestion, rhinorrhea, ST, epistaxis, or sinus pressure Cardiovascular: negative for chest pain or palpitations Respiratory: negative  for hemoptysis, wheezing, shortness of breath, or cough Abdominal: negative for abdominal pain, nausea, vomiting, diarrhea, or constipation Dermatological: negative for rash Neurologic: negative for headache, dizziness, or syncope All other systems reviewed and are otherwise negative with the exception to those above and in the HPI.  PHYSICAL EXAM: Filed Vitals:   06/01/15 1024  BP: 128/84  Pulse: 88  Temp: 98 F (36.7 C)  Resp: 18   Body mass index is 36.31 kg/(m^2).   General: Alert, no acute distress HEENT:  Normocephalic, atraumatic, oropharynx patent. Eye: Nonie HoyerOMI, [facility] Cardiovascular: Regular rhythm, no rubs murmurs or  gallops.  No Carotid bruits, radial pulse intact. No pedal edema. Rare skipped beat Respiratory: Clear to auscultation bilaterally.  No wheezes, rales, or rhonchi.  No cyanosis, no use of accessory musculature Abdominal: No organomegaly, abdomen is soft and non-tender, positive bowel sounds. No masses. Musculoskeletal: Gait intact. No edema, tenderness Skin: No rashes. Neurologic: Facial musculature symmetric. Psychiatric: Patient acts appropriately throughout our interaction.  Lymphatic: No cervical or submandibular lymphadenopathy Genitourinary/Anorectal: No acute findings   BP recheck in room, sitting (left arm): 160/100 BP recheck in room, sitting (right arm): 160/100  LABS:  Results for orders placed or performed in visit on 06/01/15  POCT CBC  Result Value Ref Range   WBC 8.9 4.6 - 10.2 K/uL   Lymph, poc 2.1 0.6 - 3.4   POC LYMPH PERCENT 24.0 10 - 50 %L   MID (cbc) 0.7 0 - 0.9   POC MID % 8.4 0 - 12 %M   POC Granulocyte 6.0 2 - 6.9   Granulocyte percent 67.6 37 - 80 %G   RBC 5.72 4.69 - 6.13 M/uL   Hemoglobin 17.8 14.1 - 18.1 g/dL   HCT, POC 16.151.0 09.643.5 - 53.7 %   MCV 89.1 80 - 97 fL   MCH, POC 31.1 27 - 31.2 pg   MCHC 34.9 31.8 - 35.4 g/dL   RDW, POC 04.513.8 %   Platelet Count, POC 150 142 - 424 K/uL   MPV 9.5 0 - 99.8 fL    EKG/XRAY:   Primary read [facility]. EKG: occasional PAC's  ASSESSMENT/PLAN: Will restart lisinopril 20. Recheck 3 weeks. Sleep study ordered. Basic metabolic panel and magnesium level ordered. He was encouraged to work on stopping smoking and a walking program. His EKG showed occasional PACs but otherwise unremarkable. He was encouraged to stop smoking, start walking, and given a copy of a Dash diet.  By signing my name below, I, Stann Oresung-Kai Tsai, attest that this documentation has been prepared under the direction and in the presence of Lesle ChrisSteven Dewey Viens, MD. Electronically Signed: Stann Oresung-Kai Tsai, Scribe. 06/01/2015 , 10:36 AM  .    Michaell CowingGross sideeffects, risk and benefits, and alternatives of medications d/w patient. Patient is aware that all medications have potential sideeffects and we are unable to predict every sideeffect or drug-drug interaction that may occur.  Lesle ChrisSteven Terica Yogi MD 06/01/2015 10:36 AM

## 2015-06-14 ENCOUNTER — Encounter: Payer: Self-pay | Admitting: Neurology

## 2015-06-14 ENCOUNTER — Ambulatory Visit (INDEPENDENT_AMBULATORY_CARE_PROVIDER_SITE_OTHER): Payer: BLUE CROSS/BLUE SHIELD | Admitting: Neurology

## 2015-06-14 VITALS — BP 158/98 | HR 94 | Resp 20 | Ht 72.0 in | Wt 269.0 lb

## 2015-06-14 DIAGNOSIS — G4700 Insomnia, unspecified: Secondary | ICD-10-CM | POA: Diagnosis not present

## 2015-06-14 DIAGNOSIS — R0683 Snoring: Secondary | ICD-10-CM | POA: Insufficient documentation

## 2015-06-14 DIAGNOSIS — E662 Morbid (severe) obesity with alveolar hypoventilation: Secondary | ICD-10-CM | POA: Diagnosis not present

## 2015-06-14 DIAGNOSIS — I10 Essential (primary) hypertension: Secondary | ICD-10-CM | POA: Diagnosis not present

## 2015-06-14 DIAGNOSIS — G4730 Sleep apnea, unspecified: Secondary | ICD-10-CM

## 2015-06-14 HISTORY — DX: Insomnia, unspecified: G47.00

## 2015-06-14 HISTORY — DX: Morbid (severe) obesity with alveolar hypoventilation: E66.2

## 2015-06-14 HISTORY — DX: Morbid (severe) obesity due to excess calories: E66.01

## 2015-06-14 HISTORY — DX: Sleep apnea, unspecified: G47.30

## 2015-06-14 MED ORDER — TRAZODONE HCL 50 MG PO TABS: 50.0000 mg | ORAL_TABLET | Freq: Every day | ORAL | Status: DC

## 2015-06-14 NOTE — Progress Notes (Signed)
SLEEP MEDICINE CLINIC   Provider:  Melvyn Novasarmen  Taleeyah Bora, M D  Referring Provider: Collene Gobbleaub, Steven A, MD Primary Care Physician:  Lucilla EdinAUB, STEVE A, MD  Chief Complaint  Patient presents with  . New Patient (Initial Visit)    snoring, obesity, hypertension, rm 10, alone    HPI:  Cory Jenkins is a 49 y.o. male , seen here as a referral  from Dr. Cleta Albertsaub for insomnia, controlled on Ambien.   Chief complaint according to patient :  "I had a major depressive episode in 2010 and attributed this to lack of sleep "  Cory Jenkins reports that in 2010 he had significant work and social complications from worsening insomnia. He finally developed a major depressive episode. He was seen by Dr. Marijo ConceptionPlonski who initiated several medications and was in 14 days he felt much better. Ambien was also prescribed at the time including nortriptyline and Cymbalta. He has not had a major depressive episode since. His insomnia has been controlled on Ambien for the last 5-1/2 years. His primary care physician however is concerned about the chronic use. In addition, the patient has developed hypertension. During the last visit with Dr. Cleta Albertsaub  on 06-01-15 he measured 160/100 mmHg in the left and right arm. Reportedly he had a colonoscopy and his blood pressure measured 180/129 and then finally while sedated 201/130 mmHg . This was the main reason for today's referral. He was not taking any antihypertensives. He was started on lisinopril by Dr. Cleta Albertsaub after his visit.   He has been reported to snore at night but nobody has witnessed sleep apnea. He smokes less than half a pack per day. He does not have a regular exercise regimen and he is obese. His weight has been 267 pounds, slowly creeping up.  His current medicines include Pamelor, Ambien, Lisinopril.  The patient lost weight after a lap band procedure- down  from 330 pounds.   Sleep habits are as follows: The patient's bedtime is usually 11 PM and he falls asleep promptly with  medication, he rises at 7:30 AM. Some nights he will have one trip to the bathroom but usually he sleeps through the night. He would get about 8 hours of sleep nightly. He wakes up refreshed and restored. He does neither have headaches nor dry mouth in the morning. His bedroom is described as cool, quiet and dark. He shares a bed with his wife. The patient usually sleeps on his side or on his stomach. He sleeps a fetal position and wakes up this way. His wife reports that he is kicking and moving at night but he does not wake up from this. He does not sleep talk or sleep walk. Sometimes he will remember his dreams in the morning but they usually not nightmarish or like a night terror .   Sleep medical history and family sleep history:  No family member with known apnea   Social history: Married, regular dayshift work, at this time no exercise regimen, the couple lives with her 49 year old daughter. Smokes less than a half pack per day, no alcohol use, and limited caffeine intake. 2-3 cups a day.  Review of Systems: Out of a complete 14 system review, the patient complains of only the following symptoms, and all other reviewed systems are negative.    Social History   Social History  . Marital Status: Married    Spouse Name: N/A  . Number of Children: N/A  . Years of Education: N/A   Occupational History  .  Not on file.   Social History Main Topics  . Smoking status: Light Tobacco Smoker  . Smokeless tobacco: Never Used  . Alcohol Use: No  . Drug Use: No  . Sexual Activity: Not on file   Other Topics Concern  . Not on file   Social History Narrative    Family History  Problem Relation Age of Onset  . Healthy Father     Past Medical History  Diagnosis Date  . Depression   . Hypertension     Past Surgical History  Procedure Laterality Date  . Laparoscopic gastric banding    . Orchiectomy      Current Outpatient Prescriptions  Medication Sig Dispense Refill  .  lisinopril (PRINIVIL,ZESTRIL) 20 MG tablet Take 1 tablet (20 mg total) by mouth daily. 90 tablet 3  . nortriptyline (PAMELOR) 25 MG capsule Take 75 mg by mouth daily.     Marland Kitchen zolpidem (AMBIEN) 10 MG tablet Take 10 mg by mouth at bedtime as needed for sleep.     No current facility-administered medications for this visit.    Allergies as of 06/14/2015 - Review Complete 06/14/2015  Allergen Reaction Noted  . Keflex [cephalexin] Other (See Comments) 02/16/2015  . Sulfa antibiotics Hives 02/16/2015    Vitals: BP 158/98 mmHg  Pulse 94  Resp 20  Ht 6' (1.829 m)  Wt 269 lb (122.018 kg)  BMI 36.48 kg/m2 Last Weight:  Wt Readings from Last 1 Encounters:  06/14/15 269 lb (122.018 kg)   ZOX:WRUE mass index is 36.48 kg/(m^2).     Last Height:   Ht Readings from Last 1 Encounters:  06/14/15 6' (1.829 m)    Physical exam:  General: The patient is awake, alert and appears not in acute distress. The patient is well groomed. Head: Normocephalic, atraumatic. Neck is supple. Mallampati 4-5 , uvula is not visible. Full facial hair.  neck circumference: 17-1/2 inches. Nasal airflow unrestricted, TMJ is  not evident . Retrognathia is not seen.  Cardiovascular:  Regular rate and rhythm , without  murmurs or carotid bruit, and without distended neck veins. Respiratory: Lungs are clear to auscultation. Skin:  Without evidence of edema, or rash Trunk: BMI is elevated.  Neurologic exam : The patient is awake alert, oriented to place and time.  Attention span & concentration ability appears normal.  Speech is fluent,  without dysarthria, dysphonia or aphasia.  Mood and affect are appropriate.  Cranial nerves: Pupils are equal and briskly reactive to light. Funduscopic exam without  evidence of pallor or edema. Extraocular movements  in vertical and horizontal planes intact and without nystagmus. Visual fields by finger perimetry are intact. Hearing to finger rub intact.   Facial sensation intact to  fine touch.  Facial motor strength is symmetric and tongue and uvula move midline. Shoulder shrug was symmetrical.   Motor exam:   Normal tone, muscle bulk and symmetric strength in all extremities. Reduced grip strength. No pronator drift.  Sensory:  Fine touch, pinprick and vibration were tested in all extremities. Proprioception was normal.  Coordination: Rapid alternating movements in the fingers/hands was normal. Finger-to-nose maneuver normal without evidence of ataxia, dysmetria . There is a very mild tremor noted independent of position or action. The tremor was not noted at rest. Gait and station: Patient walks without assistive device and is able unassisted to climb up to the exam table. Strength within normal limits.  Stance is stable and normal.  Toe and heel stand were tested .Tandem gait is  unfragmented. Turns with 3   Steps. Romberg testing is  negative.  Deep tendon reflexes: in the upper and lower extremities are very brisk, symmetric and intact. Babinski maneuver response is  downgoing.  The patient was advised of the nature of the diagnosed sleep disorder , the treatment options and risks for general a health and wellness arising from not treating the condition.  I spent more than 45 minutes of face to face time with the patient. Greater than 50% of time was spent in counseling and coordination of care. We have discussed the diagnosis and differential and I answered the patient's questions.     Assessment:  After physical and neurologic examination, review of laboratory studies,  Personal review of imaging studies, reports of other /same  Imaging studies ,  Results of polysomnography/ neurophysiology testing and pre-existing records as far as provided in visit., my assessment is   1) Cory Jenkins presented here today again with elevated blood pressure levels. His risk factors are his elevated body mass index, but he is still a smoker all bite on a low level. He has risk factors  for obstructive sleep apnea and has been told that she snores. Obstructive sleep apnea, if left untreated promotes atrial fibrillation, strokes,  myocardial infarction and coronary artery disease.  2) hypoxemia in a smoker;  I will order a attended sleep study for this patient without capnography. He does not have any history of parasomnias, and the needs to be no special montage by EEG. Dr. Cleta Alberts had already ordered an overnight pulse oximetry for the patient that the results were not part of the referral. This was in 2009. The patient was prescribed oxygen for nocturnal use but couldn't tolerate it and couldn't sleep with it.   3) insomnia. This was still at the time when he had significant insomnia and prior to being placed on Ambien. As to his Ambien use I would like for him to try to take Ambien 5 nights a week and get a Costanzo drug holiday in between. I would be happy to replace the Ambien on those 2 days with a different medication, trazodone or Silenor.  4) malignant hypertension, essential.      Porfirio Mylar Amareon Phung MD  06/14/2015   CC: Collene Gobble, Md [address] [place], [place] [address]

## 2015-07-04 ENCOUNTER — Telehealth: Payer: Self-pay | Admitting: Emergency Medicine

## 2015-07-04 ENCOUNTER — Ambulatory Visit (INDEPENDENT_AMBULATORY_CARE_PROVIDER_SITE_OTHER): Payer: BLUE CROSS/BLUE SHIELD | Admitting: Emergency Medicine

## 2015-07-04 ENCOUNTER — Other Ambulatory Visit: Payer: Self-pay | Admitting: Emergency Medicine

## 2015-07-04 ENCOUNTER — Encounter: Payer: Self-pay | Admitting: Emergency Medicine

## 2015-07-04 VITALS — BP 156/102 | HR 91 | Temp 97.8°F | Resp 14 | Ht 72.0 in | Wt 269.0 lb

## 2015-07-04 DIAGNOSIS — I10 Essential (primary) hypertension: Secondary | ICD-10-CM

## 2015-07-04 DIAGNOSIS — I491 Atrial premature depolarization: Secondary | ICD-10-CM

## 2015-07-04 LAB — BASIC METABOLIC PANEL WITH GFR
BUN: 14 mg/dL (ref 7–25)
CALCIUM: 9 mg/dL (ref 8.6–10.3)
CO2: 26 mmol/L (ref 20–31)
CREATININE: 1.07 mg/dL (ref 0.60–1.35)
Chloride: 105 mmol/L (ref 98–110)
GFR, EST NON AFRICAN AMERICAN: 81 mL/min (ref >=60)
GLUCOSE: 120 mg/dL — AB (ref 65–99)
Potassium: 4 mmol/L (ref 3.5–5.3)
Sodium: 139 mmol/L (ref 135–146)

## 2015-07-04 MED ORDER — DILTIAZEM HCL ER COATED BEADS 180 MG PO TB24: 180.0000 mg | ORAL_TABLET | Freq: Every day | ORAL | Status: DC

## 2015-07-04 NOTE — Progress Notes (Addendum)
This chart was scribed for Cory ChrisSteven Leanore Biggers, MD by Cory Jenkins, ED Scribe. This patient was seen in room 4 and the patient's care was started at 8:27 AM.  Chief Complaint:  Chief Complaint  Patient presents with  . Follow-up    Hypertension    HPI: Cory Jenkins is a 49 y.o. male [facility]N follow up. Last visit was 10/19. He was started on lisinopril 20 and was to recheck in 3 weeks. Pt states his BP has been high. He is tolerating medication but reports urinary frequency.  He has occasional skip beats and is curious that this may be due recent CT abdomen finding on 02/16/15 which show Lipomatous hypertrophy of the interatrial septum of the lower chest.  He was seen by neurology 11/1. Sleep study scheduled for 12/4. He was advised to cut back on Ambien which he has been taking every night. He drinks about 3 cups a coffee a day and maybe a soda.    He is UTD on colonoscopy. 3 polyps were removed.   He had his flu shot last month.   Past Medical History  Diagnosis Date  . Depression   . Hypertension    Past Surgical History  Procedure Laterality Date  . Laparoscopic gastric banding    . Orchiectomy     Social History   Social History  . Marital Status: Married    Spouse Name: N/A  . Number of Children: N/A  . Years of Education: N/A   Social History Main Topics  . Smoking status: Light Tobacco Smoker  . Smokeless tobacco: Never Used  . Alcohol Use: No  . Drug Use: No  . Sexual Activity: Not Asked   Other Topics Concern  . None   Social History Narrative   Family History  Problem Relation Age of Onset  . Healthy Father    Allergies  Allergen Reactions  . Keflex [Cephalexin] Other (See Comments)    Abdominal pain  . Sulfa Antibiotics Hives   Prior to Admission medications   Medication Sig Start Date End Date Taking? Authorizing Provider  lisinopril (PRINIVIL,ZESTRIL) 20 MG tablet Take 1 tablet (20 mg total) by mouth daily. 06/01/15   Yes Cory GobbleSteven A Alyne Martinson, MD  nortriptyline (PAMELOR) 25 MG capsule Take 75 mg by mouth daily.    Yes Historical Provider, MD  traZODone (DESYREL) 50 MG tablet Take 1 tablet (50 mg total) by mouth at bedtime. 06/14/15  Yes Cory Dohmeier, MD  zolpidem (AMBIEN) 10 MG tablet Take 10 mg by mouth at bedtime as needed for sleep.   Yes Historical Provider, MD     ROS: The patient denies fevers, chills, night sweats, unintentional weight loss, chest pain, palpitations, wheezing, dyspnea on exertion, nausea, vomiting, abdominal pain, dysuria, hematuria, melena, numbness, weakness, or tingling. 104  All other systems have been reviewed and were otherwise negative with the exception of those mentioned in the HPI and as above.    PHYSICAL EXAM: Filed Vitals:   07/04/15 0823  BP: 152/90  Pulse: 91  Temp: 97.8 F (36.6 C)  Resp: 14   Body mass index is 36.48 kg/(m^2).  BP left arm-156/104 BP right arm- 156/102  General: Alert, no acute distress HEENT:  Normocephalic, atraumatic, oropharynx patent. Eye: Cory Jenkins, [facility] Cardiovascular:  Regular rate and rhythm, no rubs murmurs or gallops.  No Carotid bruits, radial pulse intact. No pedal edema.  Respiratory: Clear to auscultation bilaterally.  No wheezes, rales, or rhonchi.  No cyanosis, no use of accessory musculature Abdominal: No organomegaly, abdomen is soft and non-tender, positive bowel sounds.  No masses. Musculoskeletal: Gait intact. No edema, tenderness Skin: No rashes. Neurologic: Facial musculature symmetric. Psychiatric: Patient acts appropriately throughout our interaction. Lymphatic: No cervical or submandibular lymphadenopathy    LABS:    EKG/XRAY:   Primary read interpreted by Dr. Cleta [facility] at [facility].   ASSESSMENT/PLAN: Blood pressure is still high. He is scheduled to have his sleep study. Patient noted that on his CT he showed some fatty change of the intra-atrial septum. He questioned whether this could be a trigger for his PACs. I  told him I did not know but we could get a cardiology referral. I'm sure they will echo and look at that area. Bmet was done today.I personally performed the services described in this documentation, which was scribed in my presence. The recorded information has been reviewed and is accurate once his basic metabolic panel is back we'll discuss this with Dr. Mikel Cella. May add a calcium channel blocker like diltiazem and see if that will decrease his PACs. Also given some addition to blood pressure control.I personally performed the services described in this documentation, which was scribed in my presence. The recorded information has been reviewed and is accurate.Michaell Cowing sideeffects, risk and benefits, and alternatives of medications d/w patient. Patient is aware that all medications have potential sideeffects and we are unable to predict every sideeffect or drug-drug interaction that may occur.  Cory Chris MD 07/04/2015 8:27 AM

## 2015-07-04 NOTE — Telephone Encounter (Signed)
Call kidney function normal. Will add a second BP med. Stay on lisinopril 20 qd. I have added cardizem

## 2015-07-06 NOTE — Telephone Encounter (Signed)
Left message for pt to call back  °

## 2015-07-09 NOTE — Telephone Encounter (Signed)
Spoke with pt, advised  Message from Dr. Cleta Albertsaub.

## 2015-07-17 ENCOUNTER — Ambulatory Visit (INDEPENDENT_AMBULATORY_CARE_PROVIDER_SITE_OTHER): Payer: BLUE CROSS/BLUE SHIELD | Admitting: Neurology

## 2015-07-17 DIAGNOSIS — G4733 Obstructive sleep apnea (adult) (pediatric): Secondary | ICD-10-CM | POA: Diagnosis not present

## 2015-07-17 DIAGNOSIS — I10 Essential (primary) hypertension: Secondary | ICD-10-CM

## 2015-07-17 DIAGNOSIS — G4730 Sleep apnea, unspecified: Principal | ICD-10-CM

## 2015-07-17 DIAGNOSIS — R0683 Snoring: Secondary | ICD-10-CM

## 2015-07-17 DIAGNOSIS — G4700 Insomnia, unspecified: Secondary | ICD-10-CM

## 2015-07-17 NOTE — Sleep Study (Signed)
Please see the scanned sleep study interpretation located in the Procedure tab within the Chart Review section. 

## 2015-07-26 ENCOUNTER — Telehealth: Payer: Self-pay

## 2015-07-26 NOTE — Telephone Encounter (Signed)
Patient returned your call about his sleep study. Please call him [phone]).  Thanks!

## 2015-07-26 NOTE — Telephone Encounter (Signed)
Called pt discuss sleep study results, no answer, left a message asking him to call me back.

## 2015-07-26 NOTE — Telephone Encounter (Signed)
Spoke to pt regarding his sleep study results. I advised him that his PSG revealed complex, mostly osa with hypoxemia. CPAP was ineffective and poorly tolerated. I advised him that an optimal treatment pressure was not identified in this study and Dr. Vickey Hugerohmeier recommends a full-night, attended, BiPAP/VPAP titration. Pt states that he did not tolerated the CPAP well and is unsure about this. I also mentioned doing an APAP at home for 14-21 days and if symptoms do not resolve, a standard attended PAP titration would still need to be performed. Pt states that he would rather come in for an office visit to discuss this further with Dr. Vickey Hugerohmeier before deciding. He really did not tolerate the CPAP well. An appt was made for pt tomorrow at 9:30. Pt verbalized understanding.

## 2015-07-27 ENCOUNTER — Encounter: Payer: Self-pay | Admitting: Neurology

## 2015-07-27 ENCOUNTER — Ambulatory Visit (INDEPENDENT_AMBULATORY_CARE_PROVIDER_SITE_OTHER): Payer: BLUE CROSS/BLUE SHIELD | Admitting: Neurology

## 2015-07-27 VITALS — BP 118/86 | HR 78 | Resp 20 | Ht 72.0 in | Wt 262.0 lb

## 2015-07-27 DIAGNOSIS — F17200 Nicotine dependence, unspecified, uncomplicated: Secondary | ICD-10-CM | POA: Insufficient documentation

## 2015-07-27 DIAGNOSIS — F40240 Claustrophobia: Secondary | ICD-10-CM | POA: Insufficient documentation

## 2015-07-27 DIAGNOSIS — G4733 Obstructive sleep apnea (adult) (pediatric): Secondary | ICD-10-CM | POA: Insufficient documentation

## 2015-07-27 DIAGNOSIS — G4736 Sleep related hypoventilation in conditions classified elsewhere: Secondary | ICD-10-CM | POA: Diagnosis not present

## 2015-07-27 HISTORY — DX: Sleep related hypoventilation in conditions classified elsewhere: G47.36

## 2015-07-27 HISTORY — DX: Claustrophobia: F40.240

## 2015-07-27 NOTE — Progress Notes (Signed)
SLEEP MEDICINE CLINIC   Provider:  Melvyn Novasarmen  Brittany Osier, M D  Referring Provider: Collene Gobbleaub, Steven A, MD Primary Care Physician:  Lucilla EdinAUB, STEVE A, MD  Chief Complaint  Patient presents with  . Follow-up    sleep study results, wants to discuss titration versus apap, did not tolerate cpap well, rm 11, alone    HPI:  Cory Jenkins is a 49 y.o. male , seen here as a referral  from Dr. Cleta Albertsaub for insomnia, controlled on Ambien.   Chief complaint according to patient :  "I had a major depressive episode in 2010 and attributed this to lack of sleep "  Cory Jenkins reports that in 2010 he had significant work and social complications from worsening insomnia. He finally developed a major depressive episode. He was seen by Dr. Marijo ConceptionPlonski who initiated several medications and was in 14 days he felt much better. Ambien was also prescribed at the time including nortriptyline and Cymbalta. He has not had a major depressive episode since. His insomnia has been controlled on Ambien for the last 5-1/2 years. His primary care physician however is concerned about the chronic use. In addition, the patient has developed hypertension. During the last visit with Dr. Cleta Albertsaub  on 06-01-15 he measured 160/100 mmHg in the left and right arm. Reportedly he had a colonoscopy and his blood pressure measured 180/129 and then finally while sedated 201/130 mmHg . This was the main reason for today's referral. He was not taking any antihypertensives. He was started on lisinopril by Dr. Cleta Albertsaub after his visit.   He has been reported to snore at night but nobody has witnessed sleep apnea. He smokes less than half a pack per day. He does not have a regular exercise regimen and he is obese. His weight has been 267 pounds, slowly creeping up.  His current medicines include Pamelor, Ambien, Lisinopril.  The patient lost weight after a lap band procedure- down  from 330 pounds.   Sleep habits are as follows: The patient's bedtime is usually 11 PM and  he falls asleep promptly with medication, he rises at 7:30 AM. Some nights he will have one trip to the bathroom but usually he sleeps through the night. He would get about 8 hours of sleep nightly. He wakes up refreshed and restored. He does neither have headaches nor dry mouth in the morning. His bedroom is described as cool, quiet and dark. He shares a bed with his wife. The patient usually sleeps on his side or on his stomach. He sleeps a fetal position and wakes up this way. His wife reports that he is kicking and moving at night but he does not wake up from this. He does not sleep talk or sleep walk. Sometimes he will remember his dreams in the morning but they usually not nightmarish or like a night terror . Sleep medical history and family sleep history:  No family member with known apnea Social history: Married, regular dayshift work, at this time no exercise regimen, the couple lives with her 49 year old daughter. Smokes less than a half pack per day, no alcohol use, and limited caffeine intake. 2-3 cups a day. Assessment:  After physical and neurologic examination, review of laboratory studies,  Personal review of imaging studies, reports of other /same  Imaging studies ,  Results of polysomnography/ neurophysiology testing and pre-existing records as far as provided in visit., my assessment is   1) Cory Jenkins presented here today again with elevated blood pressure levels. His  risk factors are his elevated body mass index, but he is still a smoker all bite on a low level. He has risk factors for obstructive sleep apnea and has been told that she snores. Obstructive sleep apnea, if left untreated promotes atrial fibrillation, strokes,  myocardial infarction and coronary artery disease.  2) hypoxemia in a smoker;  I will order a attended sleep study for this patient without capnography. He does not have any history of parasomnias, and the needs to be no special montage by EEG. Dr. Cleta Alberts had already  ordered an overnight pulse oximetry for the patient that the results were not part of the referral. This was in 2009. The patient was prescribed oxygen for nocturnal use but couldn't tolerate it and couldn't sleep with it.   3) insomnia. This was still at the time when he had significant insomnia and prior to being placed on Ambien. As to his Ambien use I would like for him to try to take Ambien 5 nights a week and get a [place] drug holiday in between. I would be happy to replace the Ambien on those 2 days with a different medication, trazodone or Silenor.  4) malignant hypertension, essential.     Interval history from 07-27-15  Cory Jenkins is here to learn about the results of the recent split night polysomnography, performed on 07-17-15. He was diagnosed with an AHI of 22.8 in rem sleep the apnea index shot up to 89.1 per hour of sleep he spent only 9% of the recorded time in supine position and the AHI there was 84.7. By this is strong supine component the REM component is also very high. He has moderate sleep apnea but this is associated with very protracted periods of oxygen desaturation. He also had irregular heart rhythm. Dr. Jacinto Halim had recognized PACs before but these were very frequent during the EKG rhythm. He just started a calcium channel blocker for rate control. My concern was that the patient's oxygenation state low for protracted periods of time. Over 100 minutes of the first 2 hours of sleep with an oxygen nadir of 76%. The patient was started on a CPAP but could not tolerate it well there was really no sustain sleep seen after CPAP was initiated while he had comparably better sleep being untreated. I will concern now is how to treat the very hypoxemia prone patient was REM accentuated sleep apnea. This constellation does not respond well to a dental device alone some patients will use oxygen at night.  Cory Jenkins is 14 years young and I think that the very best treatment option for him is  to avoid supine sleep quit smoking and lose weight which is hard to achieve all at once.   He feels that CPAP is not tolerable for him and that an auto titrator at home would not be something he could tolerate either.  He had tried during his stay in the sleep lab to use full face as well as nasal pillow interface .  In the past, about 6 or 7 years ago, he could also not tolerate just the oxygen cannula at night. He does tend to have nasal congestions and he is a habitual mouth breather.   My recommendation today is to enter a medical supervised weight loss program with exercise. I will be happy to prescribe the medication helping him to quit smoking if that's what it takes for him he already is not somebody who prefers the supine sleep position this will cut down  on the apnea as well his nonsupine AHI was 16.6.  i suggested a PAP nap in the lab, he prefers the weight loss.   Review of Systems: Out of a complete 14 system review, the patient complains of only the following symptoms, and all other reviewed systems are negative.    Social History   Social History  . Marital Status: Married    Spouse Name: N/A  . Number of Children: N/A  . Years of Education: N/A   Occupational History  . Not on file.   Social History Main Topics  . Smoking status: Light Tobacco Smoker  . Smokeless tobacco: Never Used  . Alcohol Use: No  . Drug Use: No  . Sexual Activity: Not on file   Other Topics Concern  . Not on file   Social History Narrative    Family History  Problem Relation Age of Onset  . Healthy Father     Past Medical History  Diagnosis Date  . Depression   . Hypertension     Past Surgical History  Procedure Laterality Date  . Laparoscopic gastric banding    . Orchiectomy      Current Outpatient Prescriptions  Medication Sig Dispense Refill  . diltiazem (CARDIZEM LA) 180 MG 24 hr tablet Take 1 tablet (180 mg total) by mouth daily. 30 tablet 11  . nortriptyline  (PAMELOR) 25 MG capsule Take 75 mg by mouth daily.     . traZODone (DESYREL) 50 MG tablet Take 1 tablet (50 mg total) by mouth at bedtime. 30 tablet 0  . valsartan-hydrochlorothiazide (DIOVAN-HCT) 320-12.5 MG tablet Take 1 tablet by mouth every morning.  1  . zolpidem (AMBIEN) 10 MG tablet Take 10 mg by mouth at bedtime as needed for sleep.     No current facility-administered medications for this visit.    Allergies as of 07/27/2015 - Review Complete 07/27/2015  Allergen Reaction Noted  . Keflex [cephalexin] Other (See Comments) 02/16/2015  . Sulfa antibiotics Hives 02/16/2015    Vitals: BP 118/86 mmHg  Pulse 78  Resp 20  Ht 6' (1.829 m)  Wt 262 lb (118.842 kg)  BMI 35.53 kg/m2 Last Weight:  Wt Readings from Last 1 Encounters:  07/27/15 262 lb (118.842 kg)   ZOX:WRUE mass index is 35.53 kg/(m^2).     Last Height:   Ht Readings from Last 1 Encounters:  07/27/15 6' (1.829 m)    Physical exam:  General: The patient is awake, alert and appears not in acute distress. The patient is well groomed. Head: Normocephalic, atraumatic. Neck is supple. Mallampati 4-5 , uvula is not visible. Full facial hair.  neck circumference: 17-1/2 inches. Nasal airflow unrestricted, TMJ is  not evident . Retrognathia is not seen.  Cardiovascular:  Regular rate and rhythm , without  murmurs or carotid bruit, and without distended neck veins. Respiratory: Lungs are clear to auscultation. Skin:  Without evidence of edema, or rash Trunk: BMI is elevated.  Neurologic exam : The patient is awake alert, oriented to place and time.  Attention span & concentration ability appears normal.  Speech is fluent,  without dysarthria, dysphonia or aphasia.  Mood and affect are appropriate.  Cranial nerves: Pupils are equal and briskly reactive to light. Funduscopic exam without  evidence of pallor or edema. Extraocular movements  in vertical and horizontal planes intact and without nystagmus. Visual fields by  finger perimetry are intact. Hearing to finger rub intact.   Facial sensation intact to fine touch.  Facial motor  strength is symmetric and tongue and uvula move midline. Shoulder shrug was symmetrical.    The patient was advised of the nature of the diagnosed sleep disorder , the treatment options and risks for general a health and wellness arising from not treating the condition.  I spent more than 25 minutes of face to face time with the patient.  Greater than 50% of time was spent in counseling and coordination of care. We have discussed the diagnosis and differential and I answered the patient's questions.   See above discussion.  Weight loss,  smoking cessation. Avoiding supine sleep. He favors prone sleep.  If this fails, he will need to try a PAP nap desensitization, we will meet in 4 month.       Cory Mylar Billyjoe Go MD  07/27/2015   CC: Collene Gobble, Md [address] [place], [place] [address]

## 2015-07-27 NOTE — Patient Instructions (Signed)
Hypoxemia °Hypoxemia occurs when your blood does not contain enough oxygen. The body cannot work well when it does not have enough oxygen because every part of your body needs oxygen. Oxygen travels to all parts of the body through your blood. Hypoxemia can develop suddenly or can come on slowly. °CAUSES °Some common causes of hypoxemia include: °· Long-term (chronic) lung diseases, such as chronic obstructive pulmonary disease (COPD) or interstitial lung disease.  °· Disorders that affect breathing at night, such as sleep apnea. °· Fluid buildup in your lungs (pulmonary edema).  °· Lung infection (pneumonia). °· Lung or throat cancer. °· Abnormal blood flow that bypasses the lungs (shunt). °· Certain diseases that affect nerves or muscles. °· A collapsed lung (pneumothorax). °· A blood clot in the lungs (pulmonary embolus). °· Certain types of heart disease. °· Slow or shallow breathing (hypoventilation).  °· Certain medicines. °· High altitudes. °· Toxic chemicals and gases. °SIGNS AND SYMPTOMS °Not everyone who has hypoxemia will develop symptoms. If the hypoxemia developed quickly, you will likely have symptoms such as shortness of breath. If the hypoxemia came on slowly over months or years, you may not notice any symptoms. Symptoms can include: °· Shortness of breath (dyspnea). °· Bluish color of the skin, lips, or nail beds. °· Breathing that is fast, noisy, or shallow. °· A fast heartbeat. °· Feeling tired or sleepy. °· Being confused or feeling anxious. °DIAGNOSIS °To determine if you have hypoxemia, your health care provider may perform: °· A physical exam. °· Blood tests. °· A pulse oximetry. A sensor will be put on your finger, toe, or earlobe to measure the percent of oxygen in your blood. °TREATMENT °You will likely be treated with oxygen therapy. Depending on the cause of your hypoxemia, you may need oxygen for a short time (weeks or months), or you may need it indefinitely. Your health care provider  may also recommend other therapies to treat the underlying cause of your hypoxemia. °HOME CARE INSTRUCTIONS °· Only take over-the-counter or prescription medicines as directed by your health care provider. °· Follow oxygen safety measures if you are on oxygen therapy. These may include: °¨ Always having a backup supply of oxygen. °¨ Not allowing anyone to smoke around oxygen. °¨ Handling the oxygen tanks carefully and as instructed. °· If you smoke, quit. Stay away from people who smoke. °· Follow up with your health care provider as directed. °SEEK MEDICAL CARE IF: °· You have any concerns about your oxygen therapy. °· You still have trouble breathing. °· You become short of breath when you exercise. °· You are tired when you wake up. °· You have a headache when you wake up. °SEEK IMMEDIATE MEDICAL CARE IF:  °· Your breathing gets worse. °· You have new shortness of breath with normal activity. °· You have a bluish color of the skin, lips, or nail beds. °· You have confusion or cloudy thinking. °· You cough up dark mucus. °· You have chest pain. °· You have a fever. °MAKE SURE YOU: °· Understand these instructions. °· Will watch your condition. °· Will get help right away if you are not doing well or get worse. °  °This information is not intended to replace advice given to you by your health care provider. Make sure you discuss any questions you have with your health care provider. °  °Document Released: 02/12/2011 Document Revised: 08/04/2013 Document Reviewed: 02/26/2013 °Elsevier Interactive Patient Education ©2016 Elsevier Inc. ° °

## 2015-12-05 ENCOUNTER — Ambulatory Visit: Payer: BLUE CROSS/BLUE SHIELD | Admitting: Neurology

## 2015-12-22 ENCOUNTER — Ambulatory Visit (INDEPENDENT_AMBULATORY_CARE_PROVIDER_SITE_OTHER): Payer: 59 | Admitting: Family Medicine

## 2015-12-22 VITALS — BP 128/80 | HR 72 | Temp 98.6°F | Ht 71.5 in | Wt 261.0 lb

## 2015-12-22 DIAGNOSIS — J209 Acute bronchitis, unspecified: Secondary | ICD-10-CM

## 2015-12-22 DIAGNOSIS — F17200 Nicotine dependence, unspecified, uncomplicated: Secondary | ICD-10-CM

## 2015-12-22 MED ORDER — HYDROCOD POLST-CPM POLST ER 10-8 MG/5ML PO SUER: 5.0000 mL | Freq: Every evening | ORAL | Status: DC | PRN

## 2015-12-22 MED ORDER — AZITHROMYCIN 250 MG PO TABS: ORAL_TABLET | ORAL | Status: DC

## 2015-12-22 NOTE — Patient Instructions (Addendum)
   IF you received an x-ray today, you will receive an invoice from [facility]. Please contact [facility] at [phone] with questions or concerns regarding your invoice.   IF you received labwork today, you will receive an invoice from [facility]. Please contact [facility] at [phone] with questions or concerns regarding your invoice.   Our billing staff will not be able to assist you with questions regarding bills from these companies.  You will be contacted with the lab results as soon as they are available. The fastest way to get your results is to activate your My Chart account. Instructions are located on the last page of this paperwork. If you have not heard from us regarding the results in 2 weeks, please contact this office.    Acute Bronchitis Bronchitis is inflammation of the airways that extend from the windpipe into the lungs (bronchi). The inflammation often causes mucus to develop. This leads to a cough, which is the most common symptom of bronchitis.  In acute bronchitis, the condition usually develops suddenly and goes away over time, usually in a couple weeks. Smoking, allergies, and asthma can make bronchitis worse. Repeated episodes of bronchitis may cause further lung problems.  CAUSES Acute bronchitis is most often caused by the same virus that causes a cold. The virus can spread from person to person (contagious) through coughing, sneezing, and touching contaminated objects. SIGNS AND SYMPTOMS   Cough.   Fever.   Coughing up mucus.   Body aches.   Chest congestion.   Chills.   Shortness of breath.   Sore throat.  DIAGNOSIS  Acute bronchitis is usually diagnosed through a physical exam. Your health care provider will also ask you questions about your medical history. Tests, such as chest X-rays, are sometimes done to rule out other conditions.  TREATMENT  Acute bronchitis usually goes away in a  couple weeks. Oftentimes, no medical treatment is necessary. Medicines are sometimes given for relief of fever or cough. Antibiotic medicines are usually not needed but may be prescribed in certain situations. In some cases, an inhaler may be recommended to help reduce shortness of breath and control the cough. A cool mist vaporizer may also be used to help thin bronchial secretions and make it easier to clear the chest.  HOME CARE INSTRUCTIONS  Get plenty of rest.   Drink enough fluids to keep your urine clear or pale yellow (unless you have a medical condition that requires fluid restriction). Increasing fluids may help thin your respiratory secretions (sputum) and reduce chest congestion, and it will prevent dehydration.   Take medicines only as directed by your health care provider.  If you were prescribed an antibiotic medicine, finish it all even if you start to feel better.  Avoid smoking and secondhand smoke. Exposure to cigarette smoke or irritating chemicals will make bronchitis worse. If you are a smoker, consider using nicotine gum or skin patches to help control withdrawal symptoms. Quitting smoking will help your lungs heal faster.   Reduce the chances of another bout of acute bronchitis by washing your hands frequently, avoiding people with cold symptoms, and trying not to touch your hands to your mouth, nose, or eyes.   Keep all follow-up visits as directed by your health care provider.  SEEK MEDICAL CARE IF: Your symptoms do not improve after 1 week of treatment.  SEEK IMMEDIATE MEDICAL CARE IF:  You develop an increased fever or chills.   You have chest pain.     You have severe shortness of breath.  You have bloody sputum.   You develop dehydration.  You faint or repeatedly feel like you are going to pass out.  You develop repeated vomiting.  You develop a severe headache. MAKE SURE YOU:   Understand these instructions.  Will watch your  condition.  Will get help right away if you are not doing well or get worse.   This information is not intended to replace advice given to you by your health care provider. Make sure you discuss any questions you have with your health care provider.   Document Released: 09/06/2004 Document Revised: 08/20/2014 Document Reviewed: 01/20/2013 Elsevier Interactive Patient Education 2016 Elsevier Inc.  

## 2015-12-22 NOTE — Progress Notes (Signed)
Subjective:    Patient ID: Cory Jenkins, male    DOB: March 16, 1966, 50 y.o.   MRN: [id number] By signing my name below, I, Cory Jenkins, attest that this documentation has been prepared under the direction and in the presence of Cory SorensonEva Deann Mclaine, MD. Electronically Signed: Javier Dockerobert Ryan Jenkins, ER Scribe. 12/22/2015. 2:25 PM.  Chief Complaint  Patient presents with  . Nasal Congestion    x 3 weeks  . Cough    x 3 weeks, clear product - keeps awake at night  . Fatigue    HPI HPI Comments: Cory Companionlfred B Leone is a 50 y.o. male who [facility] complaining of nasal congestion, cough productive of clear purulence and fatigue for the last three weeks. His cough is worse at night. He is also having some wheezing. He has been using cough drops during the day and Nyquil at night. He denies fever, chills or SOB. He is eating and drinking normally. His BM and urination have been normal.    Past Medical History  Diagnosis Date  . Depression   . Hypertension    Allergies  Allergen Reactions  . Keflex [Cephalexin] Other (See Comments)    Abdominal pain  . Sulfa Antibiotics Hives   Current Outpatient Prescriptions on File Prior to Visit  Medication Sig Dispense Refill  . diltiazem (CARDIZEM LA) 180 MG 24 hr tablet Take 1 tablet (180 mg total) by mouth daily. 30 tablet 11  . nortriptyline (PAMELOR) 25 MG capsule Take 75 mg by mouth daily.     . valsartan-hydrochlorothiazide (DIOVAN-HCT) 320-12.5 MG tablet Take 1 tablet by mouth every morning.  1  . zolpidem (AMBIEN) 10 MG tablet Take 10 mg by mouth at bedtime as needed for sleep.     No current facility-administered medications on file prior to visit.    Review of Systems  Constitutional: Positive for fatigue. Negative for fever and chills.  HENT: Positive for congestion and rhinorrhea.   Respiratory: Positive for cough and wheezing. Negative for shortness of breath.       Objective:  BP 128/80 mmHg  Pulse 72  Temp(Src) 98.6 F (37 C)  (Oral)  Ht 5' 11.5" (1.816 m)  Wt 261 lb (118.389 kg)  BMI 35.90 kg/m2  SpO2 97%  Physical Exam  Constitutional: He is oriented to person, place, and time. He appears well-developed and well-nourished. No distress.  HENT:  Head: Normocephalic and atraumatic.  Left TM retracted and injected. Right TM is retracted. Oropharynx with erythema. Nares with purulent rhinitis.   Eyes: Pupils are equal, round, and reactive to light.  Neck: Neck supple.  Cardiovascular: Normal rate.   RRR  Pulmonary/Chest: Effort normal. No respiratory distress.  Lungs clear throughout.   Musculoskeletal: Normal range of motion.  Neurological: He is alert and oriented to person, place, and time. Coordination normal.  Skin: Skin is warm and dry. He is not diaphoretic.  Psychiatric: He has a normal mood and affect. His behavior is normal.  Nursing note and vitals reviewed.     Assessment & Plan:   1. Acute bronchitis, unspecified organism   2. Tobacco use disorder     Meds ordered this encounter  Medications  . azithromycin (ZITHROMAX) 250 MG tablet    Sig: Take 2 tabs PO x 1 dose, then 1 tab PO QD x 4 days    Dispense:  6 tablet    Refill:  0  . chlorpheniramine-HYDROcodone (TUSSIONEX PENNKINETIC ER) 10-8 MG/5ML SUER    Sig:  Take 5 mLs by mouth at bedtime as needed.    Dispense:  120 mL    Refill:  0    I personally performed the services described in this documentation, which was scribed in my presence. The recorded information has been reviewed and considered, and addended by me as needed.  Cory Sorenson, MD MPH

## 2016-01-06 ENCOUNTER — Ambulatory Visit (INDEPENDENT_AMBULATORY_CARE_PROVIDER_SITE_OTHER): Payer: 59 | Admitting: Emergency Medicine

## 2016-01-06 ENCOUNTER — Ambulatory Visit (INDEPENDENT_AMBULATORY_CARE_PROVIDER_SITE_OTHER): Payer: 59

## 2016-01-06 VITALS — BP 122/72 | HR 92 | Temp 98.0°F | Resp 17 | Ht 72.0 in | Wt 261.0 lb

## 2016-01-06 DIAGNOSIS — R938 Abnormal findings on diagnostic imaging of other specified body structures: Secondary | ICD-10-CM | POA: Diagnosis not present

## 2016-01-06 DIAGNOSIS — R509 Fever, unspecified: Secondary | ICD-10-CM | POA: Diagnosis not present

## 2016-01-06 DIAGNOSIS — J208 Acute bronchitis due to other specified organisms: Secondary | ICD-10-CM

## 2016-01-06 DIAGNOSIS — R9389 Abnormal findings on diagnostic imaging of other specified body structures: Secondary | ICD-10-CM

## 2016-01-06 LAB — POCT CBC
Granulocyte percent: 63.2 %G (ref 37–80)
HCT, POC: 46.3 % (ref 43.5–53.7)
Hemoglobin: 17.1 g/dL (ref 14.1–18.1)
LYMPH, POC: 1.8 (ref 0.6–3.4)
MCH, POC: 33.3 pg — AB (ref 27–31.2)
MCHC: 36.9 g/dL — AB (ref 31.8–35.4)
MCV: 90.1 fL (ref 80–97)
MID (CBC): 1.1 — AB (ref 0–0.9)
MPV: 9 fL (ref 0–99.8)
PLATELET COUNT, POC: 139 10*3/uL — AB (ref 142–424)
POC Granulocyte: 5 (ref 2–6.9)
POC LYMPH %: 23.3 % (ref 10–50)
POC MID %: 13.5 %M — AB (ref 0–12)
RBC: 5.13 M/uL (ref 4.69–6.13)
RDW, POC: 13.3 %
WBC: 7.9 10*3/uL (ref 4.6–10.2)

## 2016-01-06 LAB — POCT INFLUENZA A/B
INFLUENZA A, POC: NEGATIVE
INFLUENZA B, POC: NEGATIVE

## 2016-01-06 MED ORDER — BENZONATATE 100 MG PO CAPS: 100.0000 mg | ORAL_CAPSULE | Freq: Three times a day (TID) | ORAL | Status: DC | PRN

## 2016-01-06 MED ORDER — DOXYCYCLINE HYCLATE 100 MG PO TABS: 100.0000 mg | ORAL_TABLET | Freq: Two times a day (BID) | ORAL | Status: DC

## 2016-01-06 NOTE — Progress Notes (Signed)
By signing my name below, I, Raven Small, attest that this documentation has been prepared under the direction and in the presence of Lesle Chris, MD.  Electronically Signed: Andrew Au, ED Scribe. 01/06/2016. 12:03 PM.  Chief Complaint:  Chief Complaint  Patient presents with  . URI  . Sore Throat  . Nasal Congestion    HPI: Cory Jenkins is a 50 y.o. male who reports to [facility] today complaining of cough and sinus congestion the started 2 days ago. Pt was seen 2 weeks ago by Dr. Clelia Croft for bronchitis. States he was prescribed a zpack and symptoms had improved but began to feel bad 2 days ago. His symptoms consist of productive cough with yellow drainage, sinus congestion with clear drainage, subjective fever yesterday, sweats and a sore throat yesterday that has not improved. Pt smokes about 5-6 cigarettes a day but has been smoking less due to symptoms. Pt denies chills. Pt got a flu shot last year.   Pt had stress test 1 week ago   Past Medical History  Diagnosis Date  . Depression   . Hypertension    Past Surgical History  Procedure Laterality Date  . Laparoscopic gastric banding    . Orchiectomy     Social History   Social History  . Marital Status: Married    Spouse Name: N/A  . Number of Children: N/A  . Years of Education: N/A   Social History Main Topics  . Smoking status: Current Every Day Smoker -- 0.30 packs/day for 37 years    Types: Cigarettes  . Smokeless tobacco: Never Used  . Alcohol Use: No  . Drug Use: No  . Sexual Activity: Not Asked   Other Topics Concern  . None   Social History Narrative   Family History  Problem Relation Age of Onset  . Healthy Father    Allergies  Allergen Reactions  . Keflex [Cephalexin] Other (See Comments)    Abdominal pain  . Sulfa Antibiotics Hives   Prior to Admission medications   Medication Sig Start Date End Date Taking? Authorizing Provider  chlorpheniramine-HYDROcodone (TUSSIONEX PENNKINETIC ER) 10-8  MG/5ML SUER Take 5 mLs by mouth at bedtime as needed. 12/22/15  Yes Sherren Mocha, MD  diltiazem (CARDIZEM LA) 180 MG 24 hr tablet Take 1 tablet (180 mg total) by mouth daily. 07/04/15  Yes Collene Gobble, MD  nortriptyline (PAMELOR) 25 MG capsule Take 75 mg by mouth daily.    Yes Historical Provider, MD  valsartan-hydrochlorothiazide (DIOVAN-HCT) 320-12.5 MG tablet Take 1 tablet by mouth every morning. 07/20/15  Yes Historical Provider, MD  zolpidem (AMBIEN) 10 MG tablet Take 10 mg by mouth at bedtime as needed for sleep.   Yes Historical Provider, MD     ROS: The patient denies chills, unintentional weight loss, chest pain, palpitations, wheezing, dyspnea on exertion, nausea, vomiting, abdominal pain, dysuria, hematuria, melena, numbness, weakness, or tingling.   All other systems have been reviewed and were otherwise negative with the exception of those mentioned in the HPI and as above.    PHYSICAL EXAM: Filed Vitals:   01/06/16 1110  BP: 122/72  Pulse: 92  Temp: 98 F (36.7 C)  Resp: 17   Body mass index is 35.39 kg/(m^2).   General: Alert, no acute distress HEENT:  Normocephalic, atraumatic, oropharynx patent. Significant nasal congestion. Post oropharynx erythema.  Eye: Nonie Hoyer [facility] Cardiovascular:  Regular rate and rhythm, no rubs murmurs or gallops.  No Carotid bruits, radial pulse intact.  No pedal edema.  Respiratory: Clear to auscultation bilaterally.  No wheezes, rales, or rhonchi.  No cyanosis, no use of accessory musculature Abdominal: No organomegaly, abdomen is soft and non-tender, positive bowel sounds.  No masses. Musculoskeletal: Gait intact. No edema, tenderness Skin: No rashes. Neurologic: Facial musculature symmetric. Psychiatric: Patient acts appropriately throughout our interaction. Lymphatic: No cervical or submandibular lymphadenopathy   LABS: Results for orders placed or performed in visit on 01/06/16  POCT CBC  Result Value Ref Range   WBC 7.9 4.6 -  10.2 K/uL   Lymph, poc 1.8 0.6 - 3.4   POC LYMPH PERCENT 23.3 10 - 50 %L   MID (cbc) 1.1 (A) 0 - 0.9   POC MID % 13.5 (A) 0 - 12 %M   POC Granulocyte 5.0 2 - 6.9   Granulocyte percent 63.2 37 - 80 %G   RBC 5.13 4.69 - 6.13 M/uL   Hemoglobin 17.1 14.1 - 18.1 g/dL   HCT, POC 16.146.3 09.643.5 - 53.7 %   MCV 90.1 80 - 97 fL   MCH, POC 33.3 (A) 27 - 31.2 pg   MCHC 36.9 (A) 31.8 - 35.4 g/dL   RDW, POC 04.513.3 %   Platelet Count, POC 139 (A) 142 - 424 K/uL   MPV 9.0 0 - 99.8 fL  POCT Influenza A/B  Result Value Ref Range   Influenza A, POC Negative Negative   Influenza B, POC Negative Negative   Meds ordered this encounter  Medications  . doxycycline (VIBRA-TABS) 100 MG tablet    Sig: Take 1 tablet (100 mg total) by mouth 2 (two) times daily.    Dispense:  20 tablet    Refill:  0  . benzonatate (TESSALON) 100 MG capsule    Sig: Take 1-2 capsules (100-200 mg total) by mouth 3 (three) times daily as needed for cough.    Dispense:  40 capsule    Refill:  0    X-RAY: Dg Chest 2 View  01/06/2016  CLINICAL DATA:  Cough and fever.  Initial evaluation. EXAM: CHEST  2 VIEW COMPARISON:  02/26/2015 . FINDINGS: Mediastinum and hilar structures are normal. Left base subsegmental atelectasis and/or scarring. No pleural effusion or pneumothorax. Heart size normal. No acute bony abnormality. IMPRESSION: 1. Left base subsegmental atelectasis and/or scarring. Right upper lobe pleural parenchymal thickening consistent with scarring. Similar findings noted on prior exam. No acute abnormality. Electronically Signed   By: Maisie Fushomas  Register   On: 01/06/2016 12:42   ASSESSMENT/PLAN: Patient placed on doxycycline twice a day as well as Tessalon Perles. Recheck in 48-72 hours if not improved. There appears to be some scarring on chest x-ray. I think it would be reasonable to CT his lungs in the near future. He continues to be a smoker.I personally performed the services described in this documentation, which was scribed  in my presence. The recorded information has been reviewed and is accurate.   Gross sideeffects, risk and benefits, and alternatives of medications d/w patient. Patient is aware that all medications have potential sideeffects and we are unable to predict every sideeffect or drug-drug interaction that may occur.  Lesle ChrisSteven Hezekiah Veltre MD 01/06/2016 12:03 PM

## 2016-01-06 NOTE — Patient Instructions (Addendum)
   IF you received an x-ray today, you will receive an invoice from [facility]. Please contact [facility] at [phone] with questions or concerns regarding your invoice.   IF you received labwork today, you will receive an invoice from [facility]. Please contact [facility] at [phone] with questions or concerns regarding your invoice.   Our billing staff will not be able to assist you with questions regarding bills from these companies.  You will be contacted with the lab results as soon as they are available. The fastest way to get your results is to activate your My Chart account. Instructions are located on the last page of this paperwork. If you have not heard from us regarding the results in 2 weeks, please contact this office.    Acute Bronchitis Bronchitis is inflammation of the airways that extend from the windpipe into the lungs (bronchi). The inflammation often causes mucus to develop. This leads to a cough, which is the most common symptom of bronchitis.  In acute bronchitis, the condition usually develops suddenly and goes away over time, usually in a couple weeks. Smoking, allergies, and asthma can make bronchitis worse. Repeated episodes of bronchitis may cause further lung problems.  CAUSES Acute bronchitis is most often caused by the same virus that causes a cold. The virus can spread from person to person (contagious) through coughing, sneezing, and touching contaminated objects. SIGNS AND SYMPTOMS   Cough.   Fever.   Coughing up mucus.   Body aches.   Chest congestion.   Chills.   Shortness of breath.   Sore throat.  DIAGNOSIS  Acute bronchitis is usually diagnosed through a physical exam. Your health care provider will also ask you questions about your medical history. Tests, such as chest X-rays, are sometimes done to rule out other conditions.  TREATMENT  Acute bronchitis usually goes away in a  couple weeks. Oftentimes, no medical treatment is necessary. Medicines are sometimes given for relief of fever or cough. Antibiotic medicines are usually not needed but may be prescribed in certain situations. In some cases, an inhaler may be recommended to help reduce shortness of breath and control the cough. A cool mist vaporizer may also be used to help thin bronchial secretions and make it easier to clear the chest.  HOME CARE INSTRUCTIONS  Get plenty of rest.   Drink enough fluids to keep your urine clear or pale yellow (unless you have a medical condition that requires fluid restriction). Increasing fluids may help thin your respiratory secretions (sputum) and reduce chest congestion, and it will prevent dehydration.   Take medicines only as directed by your health care provider.  If you were prescribed an antibiotic medicine, finish it all even if you start to feel better.  Avoid smoking and secondhand smoke. Exposure to cigarette smoke or irritating chemicals will make bronchitis worse. If you are a smoker, consider using nicotine gum or skin patches to help control withdrawal symptoms. Quitting smoking will help your lungs heal faster.   Reduce the chances of another bout of acute bronchitis by washing your hands frequently, avoiding people with cold symptoms, and trying not to touch your hands to your mouth, nose, or eyes.   Keep all follow-up visits as directed by your health care provider.  SEEK MEDICAL CARE IF: Your symptoms do not improve after 1 week of treatment.  SEEK IMMEDIATE MEDICAL CARE IF:  You develop an increased fever or chills.   You have chest pain.     You have severe shortness of breath.  You have bloody sputum.   You develop dehydration.  You faint or repeatedly feel like you are going to pass out.  You develop repeated vomiting.  You develop a severe headache. MAKE SURE YOU:   Understand these instructions.  Will watch your  condition.  Will get help right away if you are not doing well or get worse.   This information is not intended to replace advice given to you by your health care provider. Make sure you discuss any questions you have with your health care provider.   Document Released: 09/06/2004 Document Revised: 08/20/2014 Document Reviewed: 01/20/2013 Elsevier Interactive Patient Education 2016 Elsevier Inc.  

## 2016-02-20 ENCOUNTER — Ambulatory Visit
Admission: RE | Admit: 2016-02-20 | Discharge: 2016-02-20 | Disposition: A | Discharge status: Home / Self Care | Payer: 59 | Source: Ambulatory Visit | Attending: Emergency Medicine | Admitting: Emergency Medicine

## 2016-02-20 DIAGNOSIS — R9389 Abnormal findings on diagnostic imaging of other specified body structures: Secondary | ICD-10-CM

## 2016-02-24 ENCOUNTER — Telehealth: Payer: Self-pay

## 2016-02-24 NOTE — Telephone Encounter (Signed)
[facility] called to ask for the order for the patient's CT Chest to be sent to their office.  They said it was performed on 02/20/16, but they never got an order from us.  The order shows in Epic and the results are attached to Dr Ellis Parentsaub's order, so I am not sure what they are referring to.  Please advise.  CB#: [phone]

## 2016-02-24 NOTE — Telephone Encounter (Signed)
[facility] was unaware of any issue involving this pt. She states that everything should be fine since we are all on epic and the procedure was completed on 7/10

## 2016-05-15 ENCOUNTER — Encounter (HOSPITAL_COMMUNITY): Payer: Self-pay

## 2016-06-02 ENCOUNTER — Other Ambulatory Visit: Payer: Self-pay | Admitting: Emergency Medicine

## 2016-06-02 DIAGNOSIS — I10 Essential (primary) hypertension: Secondary | ICD-10-CM

## 2016-07-01 ENCOUNTER — Other Ambulatory Visit: Payer: Self-pay | Admitting: Physician Assistant

## 2016-07-01 DIAGNOSIS — I10 Essential (primary) hypertension: Secondary | ICD-10-CM

## 2016-09-13 ENCOUNTER — Telehealth: Payer: 59 | Admitting: Nurse Practitioner

## 2016-09-13 DIAGNOSIS — R6889 Other general symptoms and signs: Secondary | ICD-10-CM

## 2016-09-13 MED ORDER — OSELTAMIVIR PHOSPHATE 75 MG PO CAPS: 75.0000 mg | ORAL_CAPSULE | Freq: Two times a day (BID) | ORAL | 0 refills | Status: AC

## 2016-09-13 NOTE — Progress Notes (Signed)

## 2016-11-12 IMAGING — CT CT CHEST W/O CM
3 of 4 series · 17 of 30 positions shown, 19 images · non-contrast
Comparison: Chest x-ray 01/06/2016

CLINICAL DATA: Abnormal chest x-ray

EXAM:
CT CHEST WITHOUT CONTRAST
TECHNIQUE: Multidetector CT imaging of the chest was performed following the
standard protocol without IV contrast.

[Series 3: chest w/o · axial · non-contrast · 0.82mm/px · z∈[-274,-26]mm · 6 of 138 slices shown]
[im 20/138  lung]
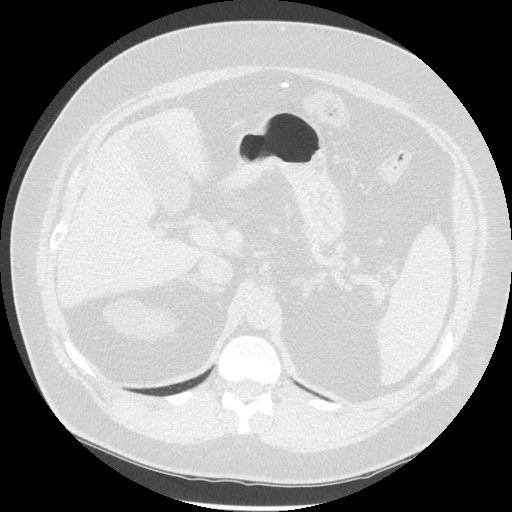
[im 40/138  lung]
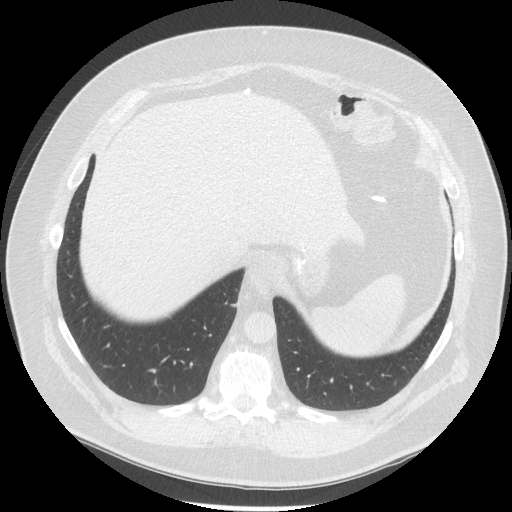
[im 59/138  lung]
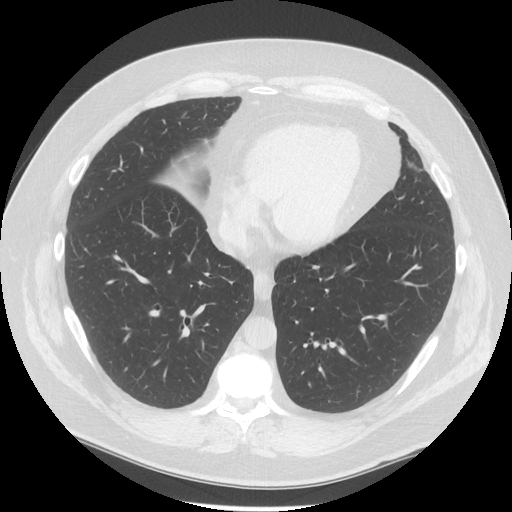
[im 79/138  lung]
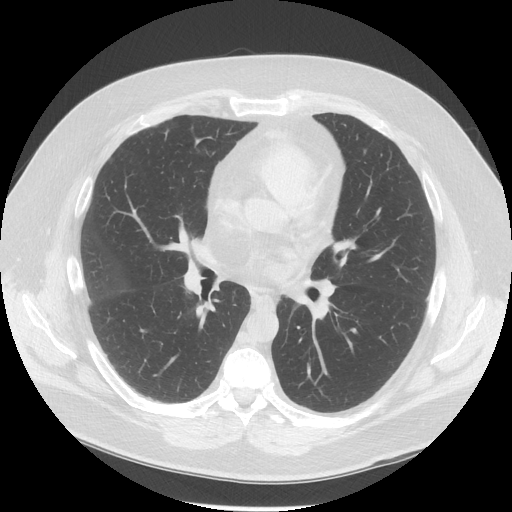
[im 98/138  lung]
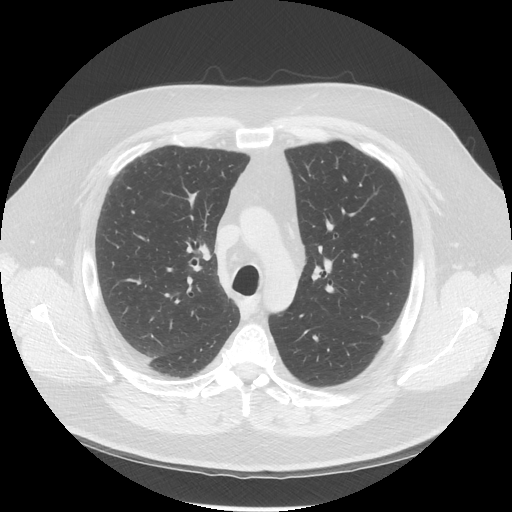
[im 118/138  lung]
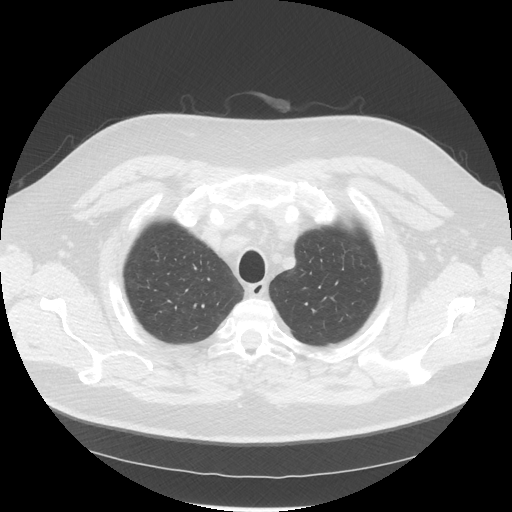

[Series 4: lung windows · axial · 0.82mm/px · z∈[-279,-21]mm · 7 of 139 slices shown, 9 images]
[im 18/139  mediastinal]
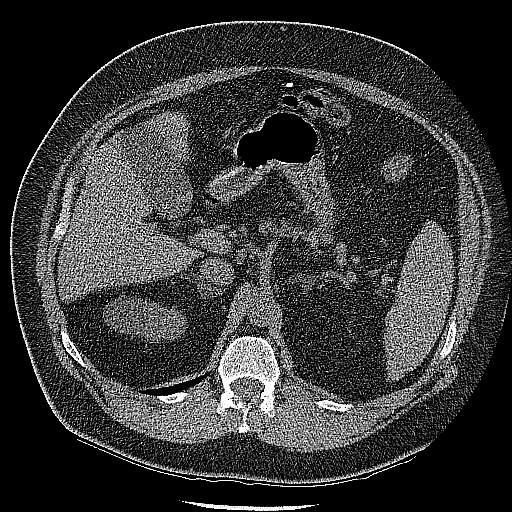
[im 18/139  lung]
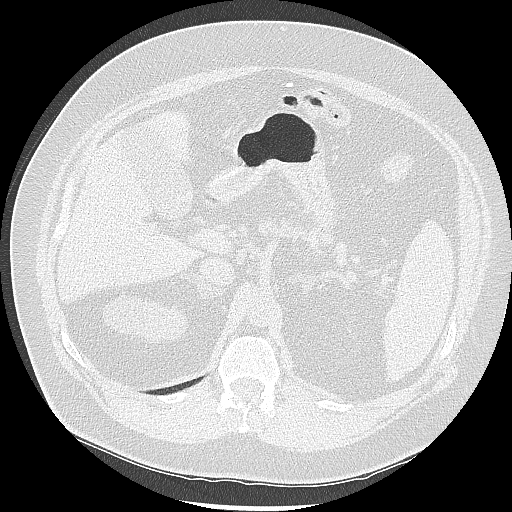
[im 35/139  lung]
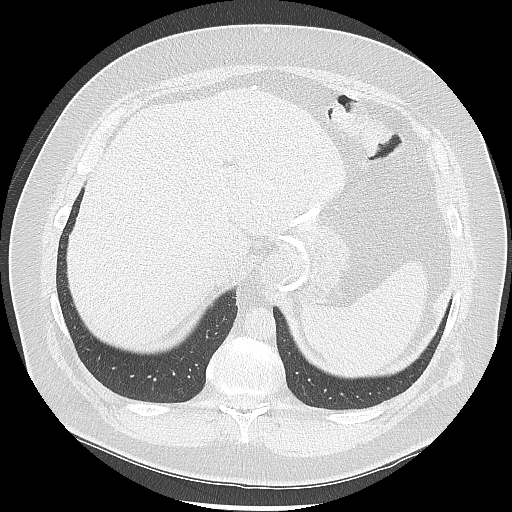
[im 52/139  lung]
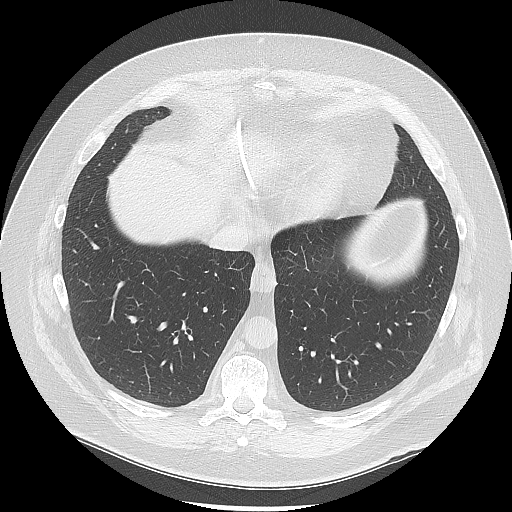
[im 70/139  lung]
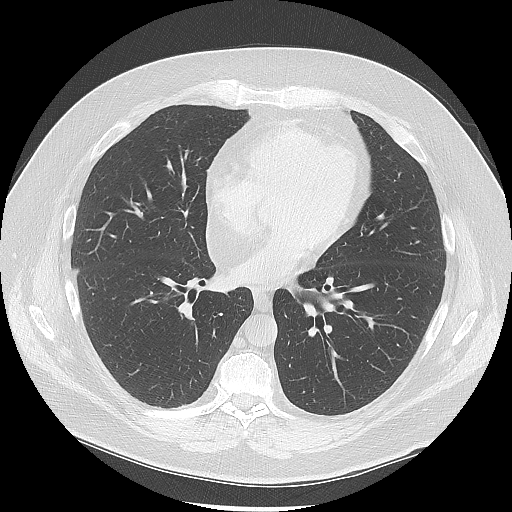
[im 87/139  mediastinal]
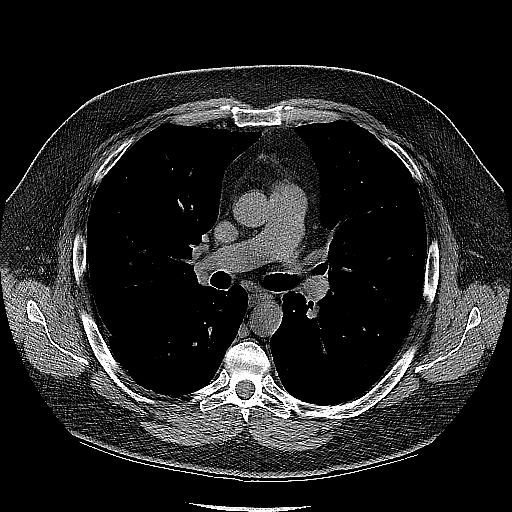
[im 87/139  lung]
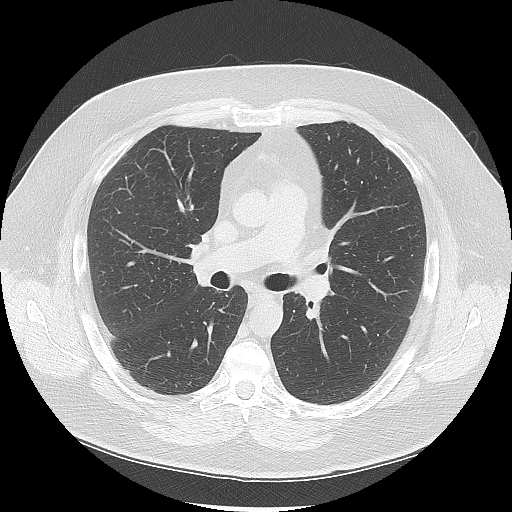
[im 104/139  lung]
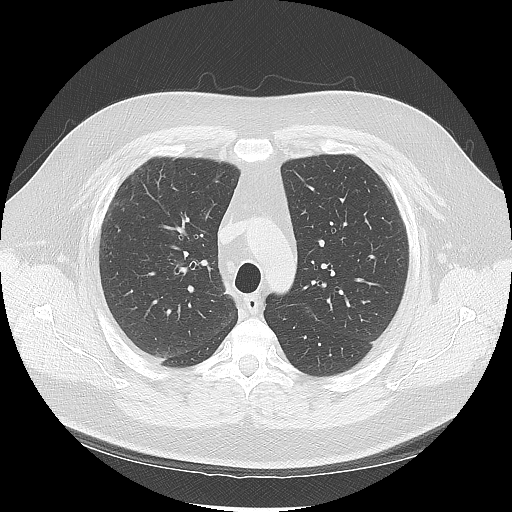
[im 121/139  lung]
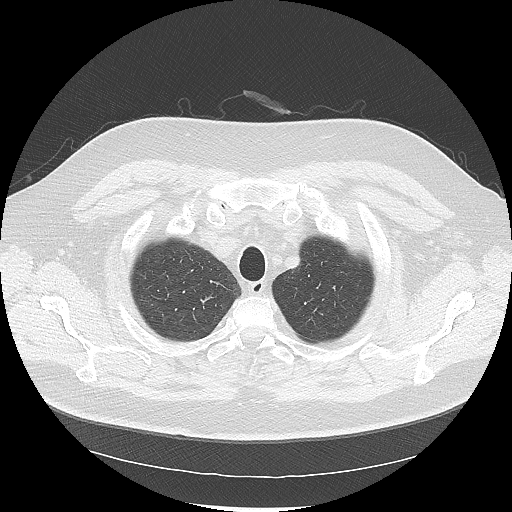

[Series 602: sagittal body · sagittal · 0.82mm/px · 4 of 169 slices shown]
[im 17/169  mediastinal]
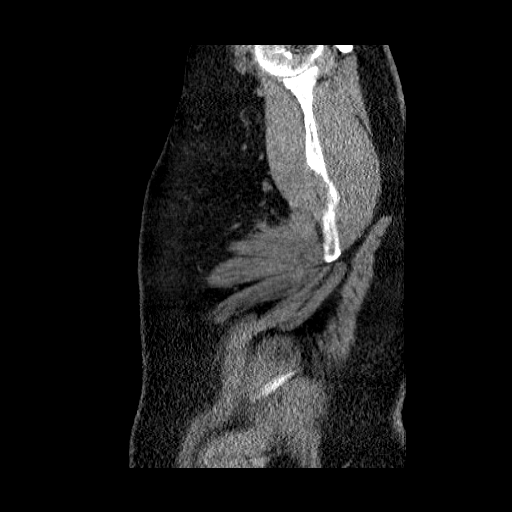
[im 34/169  mediastinal]
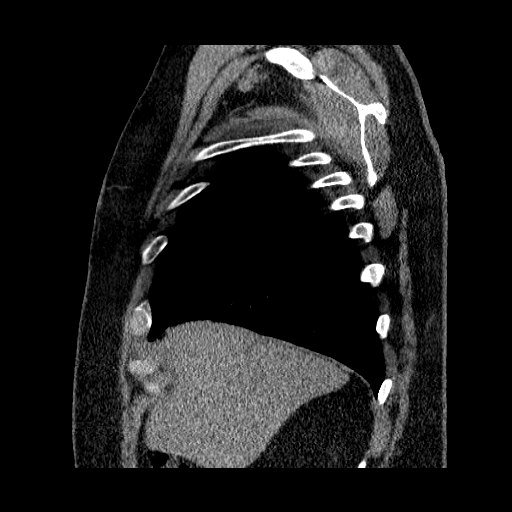
[im 51/169  mediastinal]
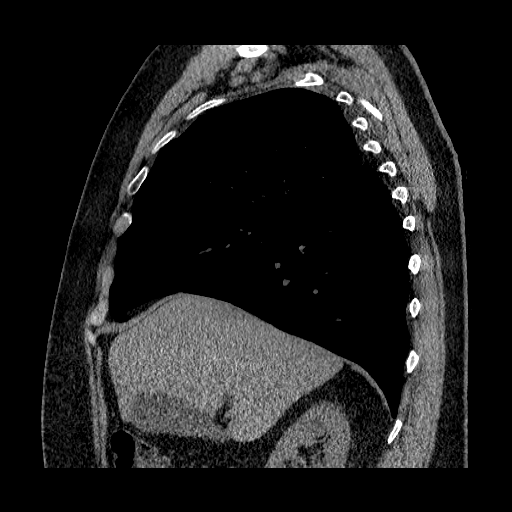
[im 68/169  mediastinal]
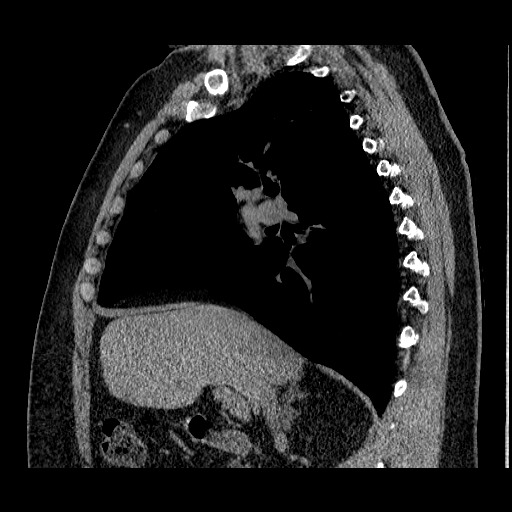

[17 of 30 positions shown; findings below may reference images not displayed]

FINDINGS: Cardiovascular: Heart is normal size. Atrial septal lipoma noted.
Scattered calcifications in the left anterior descending coronary
artery. Aorta is normal caliber.

Mediastinum/Nodes: No mediastinal, hilar, or axillary adenopathy.

Lungs/Pleura: Minimal linear densities in the lingula compatible
with minimal scarring. Lungs otherwise clear. No pleural effusions.

Upper Abdomen: Imaging into the upper abdomen shows no acute
findings. Lap band device noted in place.

Musculoskeletal: No acute bony abnormality or focal bone lesion.
IMPRESSION: No acute cardiopulmonary disease. Minimal scarring at the left base.

## 2017-09-06 ENCOUNTER — Other Ambulatory Visit: Payer: Self-pay

## 2017-09-06 ENCOUNTER — Encounter: Payer: Self-pay | Admitting: Internal Medicine

## 2017-09-06 ENCOUNTER — Ambulatory Visit (INDEPENDENT_AMBULATORY_CARE_PROVIDER_SITE_OTHER): Payer: BLUE CROSS/BLUE SHIELD | Admitting: Internal Medicine

## 2017-09-06 ENCOUNTER — Emergency Department (HOSPITAL_COMMUNITY): Payer: BLUE CROSS/BLUE SHIELD

## 2017-09-06 ENCOUNTER — Encounter (HOSPITAL_COMMUNITY): Payer: Self-pay

## 2017-09-06 ENCOUNTER — Inpatient Hospital Stay (HOSPITAL_COMMUNITY)
Admission: EM | Admit: 2017-09-06 | Discharge: 2017-09-10 | DRG: 308 | Disposition: A | Discharge status: Home / Self Care | Payer: BLUE CROSS/BLUE SHIELD | Source: Ambulatory Visit | Attending: Interventional Cardiology | Admitting: Interventional Cardiology

## 2017-09-06 ENCOUNTER — Inpatient Hospital Stay (HOSPITAL_COMMUNITY): Payer: BLUE CROSS/BLUE SHIELD

## 2017-09-06 DIAGNOSIS — Z7901 Long term (current) use of anticoagulants: Secondary | ICD-10-CM | POA: Diagnosis not present

## 2017-09-06 DIAGNOSIS — Z882 Allergy status to sulfonamides status: Secondary | ICD-10-CM

## 2017-09-06 DIAGNOSIS — I340 Nonrheumatic mitral (valve) insufficiency: Secondary | ICD-10-CM

## 2017-09-06 DIAGNOSIS — Z833 Family history of diabetes mellitus: Secondary | ICD-10-CM | POA: Diagnosis not present

## 2017-09-06 DIAGNOSIS — I5042 Chronic combined systolic (congestive) and diastolic (congestive) heart failure: Secondary | ICD-10-CM

## 2017-09-06 DIAGNOSIS — I5031 Acute diastolic (congestive) heart failure: Secondary | ICD-10-CM | POA: Diagnosis not present

## 2017-09-06 DIAGNOSIS — Z8249 Family history of ischemic heart disease and other diseases of the circulatory system: Secondary | ICD-10-CM

## 2017-09-06 DIAGNOSIS — R0683 Snoring: Secondary | ICD-10-CM | POA: Diagnosis present

## 2017-09-06 DIAGNOSIS — I483 Typical atrial flutter: Secondary | ICD-10-CM | POA: Diagnosis not present

## 2017-09-06 DIAGNOSIS — F329 Major depressive disorder, single episode, unspecified: Secondary | ICD-10-CM | POA: Diagnosis present

## 2017-09-06 DIAGNOSIS — I429 Cardiomyopathy, unspecified: Secondary | ICD-10-CM | POA: Diagnosis present

## 2017-09-06 DIAGNOSIS — D696 Thrombocytopenia, unspecified: Secondary | ICD-10-CM | POA: Diagnosis present

## 2017-09-06 DIAGNOSIS — I081 Rheumatic disorders of both mitral and tricuspid valves: Secondary | ICD-10-CM | POA: Diagnosis present

## 2017-09-06 DIAGNOSIS — I4891 Unspecified atrial fibrillation: Secondary | ICD-10-CM | POA: Diagnosis not present

## 2017-09-06 DIAGNOSIS — E662 Morbid (severe) obesity with alveolar hypoventilation: Secondary | ICD-10-CM | POA: Diagnosis present

## 2017-09-06 DIAGNOSIS — Z6837 Body mass index (BMI) 37.0-37.9, adult: Secondary | ICD-10-CM

## 2017-09-06 DIAGNOSIS — F17210 Nicotine dependence, cigarettes, uncomplicated: Secondary | ICD-10-CM | POA: Diagnosis present

## 2017-09-06 DIAGNOSIS — G4733 Obstructive sleep apnea (adult) (pediatric): Secondary | ICD-10-CM | POA: Diagnosis present

## 2017-09-06 DIAGNOSIS — R0609 Other forms of dyspnea: Secondary | ICD-10-CM

## 2017-09-06 DIAGNOSIS — I5043 Acute on chronic combined systolic (congestive) and diastolic (congestive) heart failure: Secondary | ICD-10-CM | POA: Diagnosis present

## 2017-09-06 DIAGNOSIS — I110 Hypertensive heart disease with heart failure: Secondary | ICD-10-CM | POA: Diagnosis present

## 2017-09-06 DIAGNOSIS — R0600 Dyspnea, unspecified: Secondary | ICD-10-CM

## 2017-09-06 DIAGNOSIS — F17200 Nicotine dependence, unspecified, uncomplicated: Secondary | ICD-10-CM | POA: Diagnosis present

## 2017-09-06 DIAGNOSIS — Z9884 Bariatric surgery status: Secondary | ICD-10-CM | POA: Diagnosis not present

## 2017-09-06 DIAGNOSIS — Z23 Encounter for immunization: Secondary | ICD-10-CM | POA: Diagnosis not present

## 2017-09-06 DIAGNOSIS — R000 Tachycardia, unspecified: Secondary | ICD-10-CM | POA: Diagnosis present

## 2017-09-06 DIAGNOSIS — I4892 Unspecified atrial flutter: Secondary | ICD-10-CM | POA: Diagnosis not present

## 2017-09-06 DIAGNOSIS — I313 Pericardial effusion (noninflammatory): Secondary | ICD-10-CM | POA: Diagnosis present

## 2017-09-06 DIAGNOSIS — Z881 Allergy status to other antibiotic agents status: Secondary | ICD-10-CM

## 2017-09-06 DIAGNOSIS — F40240 Claustrophobia: Secondary | ICD-10-CM | POA: Diagnosis present

## 2017-09-06 DIAGNOSIS — I10 Essential (primary) hypertension: Secondary | ICD-10-CM | POA: Diagnosis not present

## 2017-09-06 HISTORY — DX: Unspecified atrial flutter: I48.92

## 2017-09-06 HISTORY — DX: Chronic combined systolic (congestive) and diastolic (congestive) heart failure: I50.42

## 2017-09-06 HISTORY — DX: Dyspnea, unspecified: R06.00

## 2017-09-06 HISTORY — DX: Other forms of dyspnea: R06.09

## 2017-09-06 HISTORY — DX: Obstructive sleep apnea (adult) (pediatric): G47.33

## 2017-09-06 LAB — COMPREHENSIVE METABOLIC PANEL
ALBUMIN: 4.3 g/dL (ref 3.5–5.0)
ALK PHOS: 92 U/L (ref 38–126)
ALK PHOS: 80 U/L (ref 38–126)
ALT: 14 U/L — ABNORMAL LOW (ref 17–63)
ALT: 13 U/L — AB (ref 17–63)
ANION GAP: 14 (ref 5–15)
AST: 26 U/L (ref 15–41)
AST: 24 U/L (ref 15–41)
Albumin: 3.9 g/dL (ref 3.5–5.0)
Anion gap: 15 (ref 5–15)
BILIRUBIN TOTAL: 1.3 mg/dL — AB (ref 0.3–1.2)
BILIRUBIN TOTAL: 1.3 mg/dL — AB (ref 0.3–1.2)
BUN: 15 mg/dL (ref 6–20)
BUN: 15 mg/dL (ref 6–20)
CALCIUM: 9.8 mg/dL (ref 8.9–10.3)
CALCIUM: 9.3 mg/dL (ref 8.9–10.3)
CO2: 23 mmol/L (ref 22–32)
CO2: 21 mmol/L — ABNORMAL LOW (ref 22–32)
CREATININE: 1.21 mg/dL (ref 0.61–1.24)
Chloride: 101 mmol/L (ref 101–111)
Chloride: 103 mmol/L (ref 101–111)
Creatinine, Ser: 1.25 mg/dL — ABNORMAL HIGH (ref 0.61–1.24)
GFR calc non Af Amer: >60 mL/min (ref >60)
Glucose, Bld: 121 mg/dL — ABNORMAL HIGH (ref 65–99)
Glucose, Bld: 111 mg/dL — ABNORMAL HIGH (ref 65–99)
POTASSIUM: 3.9 mmol/L (ref 3.5–5.1)
Potassium: 3.7 mmol/L (ref 3.5–5.1)
Sodium: 138 mmol/L (ref 135–145)
Sodium: 139 mmol/L (ref 135–145)
TOTAL PROTEIN: 6.8 g/dL (ref 6.5–8.1)
TOTAL PROTEIN: 6.1 g/dL — AB (ref 6.5–8.1)

## 2017-09-06 LAB — CBC WITH DIFFERENTIAL/PLATELET
BASOS PCT: 0 %
Basophils Absolute: 0.1 10*3/uL (ref 0.0–0.1)
Eosinophils Absolute: 0.1 10*3/uL (ref 0.0–0.7)
Eosinophils Relative: 1 %
HEMATOCRIT: 52.1 % — AB (ref 39.0–52.0)
Hemoglobin: 17.9 g/dL — ABNORMAL HIGH (ref 13.0–17.0)
LYMPHS PCT: 15 %
Lymphs Abs: 1.8 10*3/uL (ref 0.7–4.0)
MCH: 32.2 pg (ref 26.0–34.0)
MCHC: 34.4 g/dL (ref 30.0–36.0)
MCV: 93.7 fL (ref 78.0–100.0)
Monocytes Absolute: 0.8 10*3/uL (ref 0.1–1.0)
Monocytes Relative: 7 %
NEUTROS ABS: 9.3 10*3/uL — AB (ref 1.7–7.7)
NEUTROS PCT: 77 %
Platelets: 154 10*3/uL (ref 150–400)
RBC: 5.56 MIL/uL (ref 4.22–5.81)
RDW: 14.7 % (ref 11.5–15.5)
WBC: 12.1 10*3/uL — ABNORMAL HIGH (ref 4.0–10.5)

## 2017-09-06 LAB — HEMOGLOBIN A1C
HEMOGLOBIN A1C: 6.4 % — AB (ref 4.8–5.6)
Mean Plasma Glucose: 136.98 mg/dL

## 2017-09-06 LAB — MAGNESIUM
Magnesium: 1.8 mg/dL (ref 1.7–2.4)
Magnesium: 1.6 mg/dL — ABNORMAL LOW (ref 1.7–2.4)

## 2017-09-06 LAB — TROPONIN I: Troponin I: 0.04 ng/mL (ref <0.03)

## 2017-09-06 LAB — TSH: TSH: 2.089 u[IU]/mL (ref 0.350–4.500)

## 2017-09-06 LAB — D-DIMER, QUANTITATIVE (NOT AT ARMC): D DIMER QUANT: 1.37 ug{FEU}/mL — AB (ref 0.00–0.50)

## 2017-09-06 LAB — BRAIN NATRIURETIC PEPTIDE: B NATRIURETIC PEPTIDE 5: 842 pg/mL — AB (ref 0.0–100.0)

## 2017-09-06 LAB — I-STAT CG4 LACTIC ACID, ED
LACTIC ACID, VENOUS: 1.54 mmol/L (ref 0.5–1.9)
Lactic Acid, Venous: 1.77 mmol/L (ref 0.5–1.9)

## 2017-09-06 MED ORDER — PNEUMOCOCCAL VAC POLYVALENT 25 MCG/0.5ML IJ INJ
0.5000 mL | INJECTION | INTRAMUSCULAR | Status: AC
  Administered 2017-09-07: 0.5 mL via INTRAMUSCULAR
  Filled 2017-09-06: qty 0.5

## 2017-09-06 MED ORDER — HEPARIN BOLUS VIA INFUSION
5500.0000 [IU] | Freq: Once | INTRAVENOUS | Status: AC
  Administered 2017-09-06: 5500 [IU] via INTRAVENOUS
  Filled 2017-09-06: qty 5500

## 2017-09-06 MED ORDER — SODIUM CHLORIDE 0.9 % IV BOLUS (SEPSIS)
1000.0000 mL | Freq: Once | INTRAVENOUS | Status: AC
  Administered 2017-09-06: 1000 mL via INTRAVENOUS

## 2017-09-06 MED ORDER — DILTIAZEM HCL-DEXTROSE 100-5 MG/100ML-% IV SOLN (PREMIX)
5.0000 mg/h | INTRAVENOUS | Status: DC
  Filled 2017-09-06: qty 100

## 2017-09-06 MED ORDER — INFLUENZA VAC SPLIT QUAD 0.5 ML IM SUSY
0.5000 mL | PREFILLED_SYRINGE | INTRAMUSCULAR | Status: AC
  Administered 2017-09-07: 0.5 mL via INTRAMUSCULAR
  Filled 2017-09-06: qty 0.5

## 2017-09-06 MED ORDER — ZOLPIDEM TARTRATE 5 MG PO TABS
10.0000 mg | ORAL_TABLET | Freq: Every day | ORAL | Status: DC
  Administered 2017-09-06 – 2017-09-09 (×4): 10 mg via ORAL
  Filled 2017-09-06 (×4): qty 2

## 2017-09-06 MED ORDER — ASPIRIN EC 81 MG PO TBEC
81.0000 mg | DELAYED_RELEASE_TABLET | Freq: Every day | ORAL | Status: DC
  Administered 2017-09-06 – 2017-09-07 (×2): 81 mg via ORAL
  Filled 2017-09-06 (×2): qty 1

## 2017-09-06 MED ORDER — NORTRIPTYLINE HCL 25 MG PO CAPS
75.0000 mg | ORAL_CAPSULE | Freq: Every day | ORAL | Status: DC
  Administered 2017-09-06 – 2017-09-09 (×4): 75 mg via ORAL
  Filled 2017-09-06 (×4): qty 3

## 2017-09-06 MED ORDER — ACETAMINOPHEN 325 MG PO TABS: 650.0000 mg | ORAL_TABLET | ORAL | Status: DC | PRN

## 2017-09-06 MED ORDER — FUROSEMIDE 10 MG/ML IJ SOLN
40.0000 mg | Freq: Once | INTRAMUSCULAR | Status: AC
  Administered 2017-09-06: 40 mg via INTRAVENOUS
  Filled 2017-09-06: qty 4

## 2017-09-06 MED ORDER — DILTIAZEM LOAD VIA INFUSION
20.0000 mg | Freq: Once | INTRAVENOUS | Status: AC
  Administered 2017-09-06: 20 mg via INTRAVENOUS
  Filled 2017-09-06: qty 20

## 2017-09-06 MED ORDER — ONDANSETRON HCL 4 MG/2ML IJ SOLN: 4.0000 mg | Freq: Four times a day (QID) | INTRAMUSCULAR | Status: DC | PRN

## 2017-09-06 MED ORDER — HEPARIN (PORCINE) IN NACL 100-0.45 UNIT/ML-% IJ SOLN
2200.0000 [IU]/h | INTRAMUSCULAR | Status: DC
  Administered 2017-09-06: 1550 [IU]/h via INTRAVENOUS
  Administered 2017-09-07: 1850 [IU]/h via INTRAVENOUS
  Filled 2017-09-06 (×2): qty 250

## 2017-09-06 MED ORDER — DILTIAZEM LOAD VIA INFUSION
15.0000 mg | Freq: Once | INTRAVENOUS | Status: AC
  Administered 2017-09-06: 15 mg via INTRAVENOUS
  Filled 2017-09-06: qty 15

## 2017-09-06 MED ORDER — IRBESARTAN 150 MG PO TABS
150.0000 mg | ORAL_TABLET | Freq: Every day | ORAL | Status: DC
  Administered 2017-09-07 – 2017-09-08 (×2): 150 mg via ORAL
  Filled 2017-09-06 (×2): qty 1

## 2017-09-06 MED ORDER — DILTIAZEM HCL-DEXTROSE 100-5 MG/100ML-% IV SOLN (PREMIX)
5.0000 mg/h | INTRAVENOUS | Status: DC
  Administered 2017-09-06 – 2017-09-08 (×3): 5 mg/h via INTRAVENOUS
  Filled 2017-09-06 (×2): qty 100

## 2017-09-06 NOTE — Progress Notes (Signed)
Subjective:    Patient ID: Cory Jenkins, male    DOB: [birth year],   MRN: [id number]  HPI  43  yowm active smoker with hbp but no resp problems until 08/31/17 with new onset dry cough/ chest congestion with abn cxr so referred to pulmonary clinic 09/06/2017 by Dr  Caprice Kluver  09/06/2017 1st [facility] office visit/ Cory Jenkins   Chief Complaint  Patient presents with  . Pulmonary Consult    having trouble breathing 6 days ago, had an abx, injection but still no better, cough, congestion   dry cough/ chest tightness, some nasal congestion  Feels much worse lying down or walking  rx dayquil/ mucinex dm  Cory Jenkins 09/03/17 dx ? chf > rx zpak  Could not sleep except upright  No cp n or v or diaphrosesis    No obvious day to day or daytime variability or assoc excess/ purulent sputum or mucus plugs or hemoptysis or cp   subjective wheeze or overt sinus or hb symptoms. No unusual exposure hx or h/o childhood pna/ asthma or knowledge of premature birth.    Also denies any obvious fluctuation of symptoms with weather or environmental changes or other aggravating or alleviating factors except as outlined above   Current Allergies, Complete Past Medical History, Past Surgical History, Family History, and Social History were reviewed in [employer] record.     Current Meds  Medication Sig  . nortriptyline (PAMELOR) 25 MG capsule Take 75 mg by mouth daily.   . valsartan-hydrochlorothiazide (DIOVAN-HCT) 320-12.5 MG tablet Take 1 tablet by mouth every morning.  . zolpidem (AMBIEN) 10 MG tablet Take 10 mg by mouth at bedtime as needed for sleep.  . [DISCONTINUED] chlorpheniramine-HYDROcodone (TUSSIONEX PENNKINETIC ER) 10-8 MG/5ML SUER Take 5 mLs by mouth at bedtime as needed.  . [DISCONTINUED] doxycycline (VIBRA-TABS) 100 MG tablet Take 1 tablet (100 mg total) by mouth 2 (two) times daily.  . [DISCONTINUED] MATZIM LA 180 MG 24 hr tablet TAKE 1 TABLET (180 MG TOTAL) BY  MOUTH DAILY.          Review of Systems  Constitutional: Negative for fever and unexpected weight change.  HENT: Negative for congestion, dental problem, ear pain, nosebleeds, postnasal drip, rhinorrhea, sinus pressure, sneezing, sore throat and trouble swallowing.   Eyes: Negative for redness and itching.  Respiratory: Positive for cough and shortness of breath. Negative for chest tightness and wheezing.   Cardiovascular: Negative for palpitations and leg swelling.  Gastrointestinal: Negative for nausea and vomiting.  Genitourinary: Negative for dysuria.  Musculoskeletal: Negative for joint swelling.  Skin: Negative for rash.  Neurological: Negative for headaches.  Hematological: Does not bruise/bleed easily.  Psychiatric/Behavioral: Negative for dysphoric mood. The patient is not nervous/anxious.        Objective:   Physical Exam  amb obese middle aged wm nad at rest  Wt Readings from Last 3 Encounters:  09/06/17 276 lb (125.2 kg)  01/06/16 261 lb (118.4 kg)  12/22/15 261 lb (118.4 kg)     Vital signs reviewed - Note on arrival 02 sats  95% on RA      HEENT: nl dentition, turbinates bilaterally, and oropharynx. Nl external ear canals without cough reflex   NECK :  without JVD/Nodes/TM/ nl carotid upstrokes bilaterally   LUNGS: no acc muscle use,  Nl contour chest with bilateral insp crackles with cough on inspiration    CV:  RRR  Apical pulse 140 summation gallop s m or rub  ABD:  Obese / nontender with nl inspiratory excursion in the supine position. No bruits or organomegaly appreciated, bowel sounds nl  MS:  Nl gait/ ext warm without deformities, calf tenderness, cyanosis or clubbing No obvious joint restrictions   SKIN: warm and dry without lesions    NEURO:  alert, approp, nl sensorium with  no motor or cerebellar deficits apparent.     cxr 09/02/16 c/w pulmonary edema per report  ekg 09/06/2017   Sinus tach 140 no ischemic changes            Assessment & Plan:

## 2017-09-06 NOTE — Assessment & Plan Note (Signed)
Picture most c/w diastolic chf/ new onset with ST suggesting low stroke volume so needs immediate evaluation/ diuresis   Discussed with Dr Clarene DukeLittle  rx bystolic 10 mg po now and to ER by personal transport/ ER triage aware.

## 2017-09-06 NOTE — Patient Instructions (Signed)
bystolic 10 mg one now   [facility]

## 2017-09-06 NOTE — Progress Notes (Signed)
ANTICOAGULATION CONSULT NOTE - Initial Consult  Pharmacy Consult for Heparin Indication: atrial fibrillation  Allergies  Allergen Reactions  . Keflex [Cephalexin] Other (See Comments)    Abdominal pain  . Sulfa Antibiotics Hives    Patient Measurements: Height: 6' (182.9 cm) Weight: 276 lb (125.2 kg) IBW/kg (Calculated) : 77.6 Heparin Dosing Weight: 105.5kg  Vital Signs: Temp: 97.4 F (36.3 C) (01/25 1434) Temp Source: Oral (01/25 1434) BP: 128/103 (01/25 1530) Pulse Rate: 135 (01/25 1530)  Labs: Recent Labs    09/06/17 1435  HGB 17.9*  HCT 52.1*  PLT 154  CREATININE 1.25*    Estimated Creatinine Clearance: 95.5 mL/min (A) (by C-G formula based on SCr of 1.25 mg/dL (H)).   Medical History: Past Medical History:  Diagnosis Date  . Depression   . Hypertension     Medications:  Not on anticoagulant PTA  Assessment: 51yom presented to ED for SOB. Pharmacy consulted for heparin r/o Afib. Elevated Scr at 1.25. CBC WNL. No bleeding noted.  Goal of Therapy:  Heparin level 0.3-0.7 units/ml Monitor platelets by anticoagulation protocol: Yes   Plan:  Give 5500 units bolus x 1 Start heparin infusion at 1550 units/hr  6 hour heparin level Daily heparin level and CBC  Deforest Maiden 09/06/2017,4:35 PM

## 2017-09-06 NOTE — ED Provider Notes (Signed)
Emergency Department Provider Note   I have reviewed the triage vital signs and the nursing notes.   HISTORY  Chief Complaint Tachycardia   HPI Cory Jenkins is a 52 y.o. male is obese with a history of depression, hypertension who presents the emergency department today with shortness of breath.  Patient states that he started having weakness, shortness of breath fatigue back last Saturday.  He went to urgent care on Tuesday and they said they saw pulmonary edema on his x-ray but then diagnosed him with pneumonia start him on a Z-Pak.  He did not get any better he went to see pulmonology today where he was found to have a fast heart rate.  He was sent here for further evaluation.  Patient states he is never had a real serious cough or has he had a productive cough.  No fevers.  No leg swelling.  No sick contacts.  No body aches.  He does smoke less than a pack a day.  No history of diabetes or hyperlipidemia. No other associated or modifying symptoms.    Past Medical History:  Diagnosis Date  . Depression   . Hypertension     Patient Active Problem List   Diagnosis Date Noted  . Dyspnea on exertion 09/06/2017  . Tobacco use disorder 07/27/2015  . Sleep related hypoventilation/hypoxemia in other disease 07/27/2015  . Claustrophobia 07/27/2015  . OSA (obstructive sleep apnea) 07/27/2015  . Insomnia with sleep apnea 06/14/2015  . Snoring 06/14/2015  . Obesity hypoventilation syndrome (HCC) 06/14/2015  . Morbid obesity due to excess calories (HCC) 06/14/2015  . Hx of laparoscopic gastric banding 02/16/2015  . Diverticulitis of sigmoid colon 02/16/2015    Past Surgical History:  Procedure Laterality Date  . LAPAROSCOPIC GASTRIC BANDING    . orchiectomy      Current Outpatient Rx  . Order #: [id number] Class: Historical Med  . Order #: [id number] Class: Historical Med  . Order #: [id number] Class: Historical Med    Allergies Keflex [cephalexin] and Sulfa  antibiotics  Family History  Problem Relation Age of Onset  . Healthy Father     Social History Social History   Tobacco Use  . Smoking status: Current Every Day Smoker    Packs/day: 0.30    Years: 37.00    Pack years: 11.10    Types: Cigarettes  . Smokeless tobacco: Never Used  Substance Use Topics  . Alcohol use: No    Alcohol/week: 0.0 oz  . Drug use: No    Review of Systems  All other systems negative except as documented in the HPI. All pertinent positives and negatives as reviewed in the HPI. ____________________________________________   PHYSICAL EXAM:  VITAL SIGNS: ED Triage Vitals  Enc Vitals Group     BP 09/06/17 1434 (!) 150/114     Pulse Rate 09/06/17 1434 (!) 140     Resp 09/06/17 1434 20     Temp 09/06/17 1434 (!) 97.4 F (36.3 C)     Temp Source 09/06/17 1434 Oral     SpO2 09/06/17 1434 100 %     Weight 09/06/17 1435 276 lb (125.2 kg)     Height 09/06/17 1435 6' (1.829 m)    Constitutional: Alert and oriented. Well appearing and in no acute distress. Eyes: Conjunctivae are normal. PERRL. EOMI. Head: Atraumatic. Nose: No congestion/rhinnorhea. Mouth/Throat: Mucous membranes are moist.  Oropharynx non-erythematous. Neck: No stridor.  No meningeal signs.   Cardiovascular: irregular tachycardic rate, regular rhythm. Good peripheral circulation. Grossly  normal heart sounds.   Respiratory: Normal respiratory effort.  No retractions. Lungs CTAB. Gastrointestinal: Soft and nontender. No distention.  Musculoskeletal: No lower extremity tenderness nor edema. No gross deformities of extremities. Neurologic:  Normal speech and language. No gross focal neurologic deficits are appreciated.  Skin:  Skin is warm, dry and intact. No rash noted.  ____________________________________________   LABS (all labs ordered are listed, but only abnormal results are displayed)  Labs Reviewed  COMPREHENSIVE METABOLIC PANEL  CBC WITH DIFFERENTIAL/PLATELET   URINALYSIS, ROUTINE W REFLEX MICROSCOPIC  I-STAT CG4 LACTIC ACID, ED   ____________________________________________  EKG   EKG Interpretation  Date/Time:  Friday September 06 2017 14:31:03 EST Ventricular Rate:  141 PR Interval:    QRS Duration: 92 QT Interval:  306 QTC Calculation: 468 R Axis:   116 Text Interpretation:  Atrial fibrillation with rapid ventricular response Right axis deviation T wave abnormality, consider inferior ischemia or digitalis effect Abnormal ECG tachycardia and irregular rhythm new from october 2007 Confirmed by Marily Memos [phone]) on 09/06/2017 3:16:44 PM       ____________________________________________  RADIOLOGY  No results found.  ____________________________________________   PROCEDURES  Procedure(s) performed:   Procedures  CRITICAL CARE Performed by: Marily Memos Total critical care time: 35 minutes Critical care time was exclusive of separately billable procedures and treating other patients. Critical care was necessary to treat or prevent imminent or life-threatening deterioration. Critical care was time spent personally by me on the following activities: development of treatment plan with patient and/or surrogate as well as nursing, discussions with consultants, evaluation of patient's response to treatment, examination of patient, obtaining history from patient or surrogate, ordering and performing treatments and interventions, ordering and review of laboratory studies, ordering and review of radiographic studies, pulse oximetry and re-evaluation of patient's condition.  ____________________________________________   INITIAL IMPRESSION / ASSESSMENT AND PLAN / ED COURSE  Suspect patient has atrial fibrillation currently as the cause of his symptoms.  His father is a recently retired Development worker, international aid and is Artie contacted the cardiology team here who is come to see the patient.  Diltiazem started. Heparin started. Dr. Katrinka Blazing to  see.  Dr. Katrinka Blazing to admit.      Pertinent labs & imaging results that were available during my care of the patient were reviewed by me and considered in my medical decision making (see chart for details).  ____________________________________________  FINAL CLINICAL IMPRESSION(S) / ED DIAGNOSES  Final diagnoses:  Atrial fibrillation with RVR (HCC)     MEDICATIONS GIVEN DURING THIS VISIT:  Medications  diltiazem (CARDIZEM) 1 mg/mL load via infusion 20 mg (20 mg Intravenous Bolus from Bag 09/06/17 1707)    And  diltiazem (CARDIZEM) 100 mg in dextrose 5% (1 mg/mL) infusion (5 mg/hr Intravenous New Bag/Given 09/06/17 1707)  nortriptyline (PAMELOR) capsule 75 mg (75 mg Oral Given 09/06/17 2326)  acetaminophen (TYLENOL) tablet 650 mg (not administered)  ondansetron (ZOFRAN) injection 4 mg (not administered)  diltiazem (CARDIZEM) 100 mg in dextrose 5% (1 mg/mL) infusion (5 mg/hr Intravenous Rate/Dose Change 09/06/17 1924)  aspirin EC tablet 81 mg (81 mg Oral Given 09/06/17 2140)  heparin ADULT infusion 100 units/mL (25000 units/263mL sodium chloride 0.45%) (1,550 Units/hr Intravenous New Bag/Given 09/06/17 1740)  irbesartan (AVAPRO) tablet 150 mg (not administered)  zolpidem (AMBIEN) tablet 10 mg (10 mg Oral Given 09/06/17 2326)  Influenza vac split quadrivalent PF (FLUARIX) injection 0.5 mL (not administered)  pneumococcal 23 valent vaccine (PNU-IMMUNE) injection 0.5 mL (not administered)  sodium chloride  0.9 % bolus 1,000 mL (1,000 mLs Intravenous New Bag/Given 09/06/17 1542)  diltiazem (CARDIZEM) 1 mg/mL load via infusion 15 mg (15 mg Intravenous Bolus from Bag 09/06/17 1800)  heparin bolus via infusion 5,500 Units (5,500 Units Intravenous Bolus from Bag 09/06/17 1736)  furosemide (LASIX) injection 40 mg (40 mg Intravenous Given 09/06/17 1727)    Note:  This note was prepared with assistance of Dragon voice recognition software. Occasional wrong-word or sound-a-like substitutions  may have occurred due to the inherent limitations of voice recognition software.   Marily MemosMesner, Coda Filler, MD 09/06/17 [phone]

## 2017-09-06 NOTE — ED Triage Notes (Signed)
Pt sent by dr for tachycardia in the 140s and shob. Pt had dx of recent pneumonia and pt is taking azithromycin for. Pt states he has been shob since Saturday. Afebrile.

## 2017-09-06 NOTE — Progress Notes (Signed)
Patient takes Ambien 10mg  every night at bedtime, medication is on prior to admission medication list.  RN text paged Cardiology with this information.

## 2017-09-06 NOTE — Progress Notes (Signed)
  Echocardiogram 2D Echocardiogram has been performed.  Delcie RochENNINGTON, Harlene Petralia 09/06/2017, 5:17 PM

## 2017-09-06 NOTE — ED Notes (Signed)
Per dr Katrinka Blazingsmith-- hold CTA of chest until after cardizem and heparin started

## 2017-09-06 NOTE — H&P (Addendum)
Cardiology Admission History and Physical:   Patient ID: Cory Jenkins; MRN: [id number]; DOB: 10-10-1965   Admission date: 09/06/2017  Primary Care Provider: Collene Gobbleaub, Steven A, MD Primary Cardiologist: No primary care provider on file.None Primary Electrophysiologist:  None  Chief Complaint: Dyspnea, orthopnea, and tachycardia  Patient Profile:   Cory Jenkins is a 52 y.o. male with a history history of obstructive sleep apnea, essential hypertension, obesity, depression, who is presenting today with 48-36-hour history of cough, orthopnea, and dyspnea on exertion.  EKG done earlier today in pulmonology office revealed tachycardia.  History of Present Illness:   Mr. Cory DukeLittle has no history of heart disease but over the past 36-48 hours as noted exertional fatigue, orthopnea, dyspnea on exertion, and was seen by primary physician on 09/03/2017 who thought that he had pneumonia and prescribed a Z-Pak.  Was seen today by pulmonologist, Dr. Sandrea HughsMichael Wert and felt to be in heart failure.  He was sent to the emergency room for further management.  Was apparently given Bystolic 10 mg prior to leaving the office.  He is relatively comfortable.  Has difficulty lying flat.  He denies chest pain.  His father states that he had a negative nuclear perfusion study performed by Dr. Jacinto HalimGanji within the past 18 months.   Past Medical History:  Diagnosis Date  . Depression   . Hypertension     Past Surgical History:  Procedure Laterality Date  . LAPAROSCOPIC GASTRIC BANDING    . orchiectomy       Medications Prior to Admission: Prior to Admission medications   Medication Sig Start Date End Date Taking? Authorizing Provider  nortriptyline (PAMELOR) 25 MG capsule Take 75 mg by mouth daily.     [provider]  valsartan-hydrochlorothiazide (DIOVAN-HCT) 320-12.5 MG tablet Take 1 tablet by mouth every morning. 07/20/15   [provider]  zolpidem (AMBIEN) 10 MG tablet Take 10 mg by mouth at  bedtime as needed for sleep.    [provider]     Allergies:    Allergies  Allergen Reactions  . Keflex [Cephalexin] Other (See Comments)    Abdominal pain  . Sulfa Antibiotics Hives    Social History:   Social History   Socioeconomic History  . Marital status: Married    Spouse name: Not on file  . Number of children: Not on file  . Years of education: Not on file  . Highest education level: Not on file  Social Needs  . Financial resource strain: Not on file  . Food insecurity - worry: Not on file  . Food insecurity - inability: Not on file  . Transportation needs - medical: Not on file  . Transportation needs - non-medical: Not on file  Occupational History  . Not on file  Tobacco Use  . Smoking status: Current Every Day Smoker    Packs/day: 0.30    Years: 37.00    Pack years: 11.10    Types: Cigarettes  . Smokeless tobacco: Never Used  Substance and Sexual Activity  . Alcohol use: No    Alcohol/week: 0.0 oz  . Drug use: No  . Sexual activity: Not on file  Other Topics Concern  . Not on file  Social History Narrative  . Not on file    Family History: Hypertension, diabetes, and vascular disease. The patient's family history includes Healthy in his father.    ROS:  Please see the history of present illness.  Denies lower extremity swelling.  No history of  prolonged travel.  No history of DVT or PE.  No prior history of diabetes or myocardial infarction.  He denies hemoptysis, chills, and fever. Denies chills and fever.  All other ROS reviewed and negative.     Physical Exam/Data:   Vitals:   09/06/17 1434 09/06/17 1435 09/06/17 1525 09/06/17 1530  BP: (!) 150/114   (!) 128/103  Pulse: (!) 140   (!) 135  Resp: 20   (!) 5  Temp: (!) 97.4 F (36.3 C)     TempSrc: Oral     SpO2: 100%  95% 92%  Weight:  276 lb (125.2 kg)    Height:  6' (1.829 m)     No intake or output data in the 24 hours ending 09/06/17 1633 Filed Weights   09/06/17 1435    Weight: 276 lb (125.2 kg)   Body mass index is 37.43 kg/m.  General:  Well nourished, well developed, in moderate respiratory distress when lying flat.  Comfortable lying at 45 degrees.  Obese. HEENT: normal Lymph: no adenopathy Neck: Elevated JVD to angle of the jaw at 45 degrees.  No carotid bruits are heard. Endocrine:  No thryomegaly Vascular: No carotid bruits; FA pulses 2+ bilaterally without bruits  Cardiac:  normal S1, S2; RRR; no murmur . Lungs:  clear to auscultation bilaterally.  Faint basilar rales are heard. Abd: soft, nontender, no hepatomegaly  Ext: no edema Musculoskeletal:  No deformities, BUE and BLE strength normal and equal Skin: warm and dry  Neuro:  CNs 2-12 intact, no focal abnormalities noted Psych:  Normal affect    EKG:  The ECG that was done in the emergency room on 09/06/2017 at 1559 and demonstrates right axis deviation possibly due to limb lead reversal, atrial flutter with ventricular response of 138 bpm, and no evidence of acute ischemia.  Personally reviewed.  Relevant CV Studies: Per his father, a stress nuclear study has been done within the past 2 years that revealed no evidence of ischemia and normal LV function.  Done at Dr. Verl Dicker office.  Laboratory Data:  Chemistry Recent Labs  Lab 09/06/17 1435  NA 138  K 3.9  CL 101  CO2 23  GLUCOSE 121*  BUN 15  CREATININE 1.25*  CALCIUM 9.8  GFRNONAA >60  GFRAA >60  ANIONGAP 14    Recent Labs  Lab 09/06/17 1435  PROT 6.8  ALBUMIN 4.3  AST 26  ALT 14*  ALKPHOS 92  BILITOT 1.3*   Hematology Recent Labs  Lab 09/06/17 1435  WBC 12.1*  RBC 5.56  HGB 17.9*  HCT 52.1*  MCV 93.7  MCH 32.2  MCHC 34.4  RDW 14.7  PLT 154   Cardiac EnzymesNo results for input(s): TROPONINI in the last 168 hours. No results for input(s): TROPIPOC in the last 168 hours.  BNPNo results for input(s): BNP, PROBNP in the last 168 hours.  DDimer No results for input(s): DDIMER in the last 168  hours.  Radiology/Studies:  Dg Chest 2 View  Result Date: 09/06/2017 CLINICAL DATA:  Tachycardia pneumonia EXAM: CHEST  2 VIEW COMPARISON:  01/06/2016 FINDINGS: Stable cardiomegaly. No aortic aneurysm. No pneumonic consolidations. On the lateral view there is mild increased interstitial edema and mild vascular congestion. Minimal blunting of the posterior costophrenic angles may be related to trace effusions versus pleural thickening. No acute osseous abnormality. IMPRESSION: On the lateral view there appears to be mild interstitial edema and vascular congestion with small posterior pleural effusions. No alveolar consolidations. Stable cardiomegaly. Electronically  Signed   By: Tollie Eth M.D.   On: 09/06/2017 16:26    Assessment and Plan:   1. Acute on chronic combined systolic and diastolic heart failure.  Urgent echocardiogram demonstrated severe left ventricular systolic dysfunction with EF 20-25%.  Right ventricular size is normal and function is mildly decreased.  Suspect systolic dysfunction could be related to tachycardia but other etiologies will need to be entertained, i.e inflammatory, toxin, and ischemic disease..  Plan IV Lasix, electrolyte replacement as required, and ultimate control of rhythm.  Cycle cardiac markers.  Continue ARB therapy but possibly at a lower dose given diuresis, slightly elevated creatinine, and need for IV diltiazem for rate control. 2. Atrial flutter with rapid ventricular response, unknown duration.  Suspect ongoing for days to weeks and possibly the etiology for the decreased LV function.  IV heparin, IV diltiazem to slow heart rate, will ultimately need TEE guided electrical cardioversion. 3. Obstructive sleep apnea, untreated because he is unable to tolerate the appliances that were tried. 4. Essential hypertension with poor blood pressure control currently 5. Elevated hemoglobin/mild polycythemia -uncertain etiology.  Overall, the patient is seriously ill  with poor LV function and uncontrolled atrial flutter.  Will be admitted to a progressive bed, heart rate will be slowed, IV Lasix will be given to control volume overload, and we will plan TEE guided electrical cardioversion early next week unless becomes unstable.  Severity of Illness: The appropriate patient status for this patient is INPATIENT. Inpatient status is judged to be reasonable and necessary in order to provide the required intensity of service to ensure the patient's safety. The patient's presenting symptoms, physical exam findings, and initial radiographic and laboratory data in the context of their chronic comorbidities is felt to place them at high risk for further clinical deterioration. Furthermore, it is not anticipated that the patient will be medically stable for discharge from the hospital within 2 midnights of admission. The following factors support the patient status of inpatient.   " The patient's presenting symptoms include shortness of breath. " The worrisome physical exam findings include elevated neck veins and rales on pulmonary exam. " The initial radiographic and laboratory data are worrisome because of CHF. " The chronic co-morbidities include hypertension and sleep apnea..   * I certify that at the point of admission it is my clinical judgment that the patient will require inpatient hospital care spanning beyond 2 midnights from the point of admission due to high intensity of service, high risk for further deterioration and high frequency of surveillance required.*    For questions or updates, please contact [facility] Please consult [email or web address] for contact info under Cardiology/STEMI.    Signed, Lesleigh Noe, MD  09/06/2017 4:33 PM

## 2017-09-07 DIAGNOSIS — I4892 Unspecified atrial flutter: Secondary | ICD-10-CM

## 2017-09-07 LAB — HEPARIN LEVEL (UNFRACTIONATED)

## 2017-09-07 LAB — BASIC METABOLIC PANEL
Anion gap: 13 (ref 5–15)
BUN: 16 mg/dL (ref 6–20)
CALCIUM: 8.9 mg/dL (ref 8.9–10.3)
CO2: 19 mmol/L — AB (ref 22–32)
CREATININE: 1.14 mg/dL (ref 0.61–1.24)
Chloride: 104 mmol/L (ref 101–111)
Glucose, Bld: 137 mg/dL — ABNORMAL HIGH (ref 65–99)
Potassium: 3.9 mmol/L (ref 3.5–5.1)
Sodium: 136 mmol/L (ref 135–145)

## 2017-09-07 LAB — CBC
HCT: 47.1 % (ref 39.0–52.0)
Hemoglobin: 16 g/dL (ref 13.0–17.0)
MCH: 31.7 pg (ref 26.0–34.0)
MCHC: 34 g/dL (ref 30.0–36.0)
MCV: 93.3 fL (ref 78.0–100.0)
PLATELETS: 139 10*3/uL — AB (ref 150–400)
RBC: 5.05 MIL/uL (ref 4.22–5.81)
RDW: 14.6 % (ref 11.5–15.5)
WBC: 12.3 10*3/uL — AB (ref 4.0–10.5)

## 2017-09-07 LAB — ECHOCARDIOGRAM COMPLETE
HEIGHTINCHES: 72 in
WEIGHTICAEL: 4416 [oz_av]

## 2017-09-07 LAB — LIPID PANEL
Cholesterol: 142 mg/dL (ref 0–200)
HDL: 27 mg/dL — ABNORMAL LOW (ref >40)
LDL CALC: 96 mg/dL (ref 0–99)
TRIGLYCERIDES: 96 mg/dL (ref <150)
Total CHOL/HDL Ratio: 5.3 RATIO
VLDL: 19 mg/dL (ref 0–40)

## 2017-09-07 LAB — TROPONIN I: Troponin I: 0.03 ng/mL (ref <0.03)

## 2017-09-07 LAB — HIV ANTIBODY (ROUTINE TESTING W REFLEX): HIV Screen 4th Generation wRfx: NONREACTIVE

## 2017-09-07 MED ORDER — HEPARIN BOLUS VIA INFUSION
4000.0000 [IU] | Freq: Once | INTRAVENOUS | Status: AC
  Administered 2017-09-07: 4000 [IU] via INTRAVENOUS
  Filled 2017-09-07: qty 4000

## 2017-09-07 MED ORDER — FUROSEMIDE 10 MG/ML IJ SOLN
20.0000 mg | Freq: Two times a day (BID) | INTRAMUSCULAR | Status: DC
  Filled 2017-09-07: qty 2

## 2017-09-07 MED ORDER — APIXABAN 5 MG PO TABS
5.0000 mg | ORAL_TABLET | Freq: Two times a day (BID) | ORAL | Status: DC
  Administered 2017-09-07 – 2017-09-10 (×7): 5 mg via ORAL
  Filled 2017-09-07 (×7): qty 1

## 2017-09-07 MED ORDER — FUROSEMIDE 10 MG/ML IJ SOLN
20.0000 mg | Freq: Two times a day (BID) | INTRAMUSCULAR | Status: DC
Start: 2017-09-07 — End: 2017-09-10
  Administered 2017-09-07 – 2017-09-09 (×6): 20 mg via INTRAVENOUS
  Filled 2017-09-07 (×5): qty 2

## 2017-09-07 NOTE — Progress Notes (Signed)
ANTICOAGULATION CONSULT NOTE  Pharmacy Consult for Heparin Indication: atrial fibrillation  Allergies  Allergen Reactions  . Keflex [Cephalexin] Other (See Comments)    Abdominal pain  . Sulfa Antibiotics Hives    Patient Measurements: Height: 6' (182.9 cm) Weight: 270 lb 6.4 oz (122.7 kg) IBW/kg (Calculated) : 77.6 Heparin Dosing Weight: 105 kg  Vital Signs: Temp: 97.3 F (36.3 C) (01/25 2330) Temp Source: Axillary (01/25 2330) BP: 119/83 (01/25 2330) Pulse Rate: 88 (01/25 2330)  Labs: Recent Labs    09/06/17 1435 09/06/17 1850 09/07/17 0022  HGB 17.9*  --   --   HCT 52.1*  --   --   PLT 154  --   --   HEPARINUNFRC  --   --  <0.10*  CREATININE 1.25* 1.21  --   TROPONINI  --  0.04*  --     Estimated Creatinine Clearance: 97.7 mL/min (by C-G formula based on SCr of 1.21 mg/dL).   Medical History: Past Medical History:  Diagnosis Date  . Depression   . Hypertension   . New onset atrial flutter (HCC) 09/06/2017  . OSA (obstructive sleep apnea)    "never ordered mask" (09/06/2017)    Medications:  Not on anticoagulant PTA  Assessment: 51yom presented to ED for SOB. Pharmacy consulted for heparin r/o Afib. Elevated Scr at 1.25. CBC WNL.   Initial heparin level is undetectable, spoke with RN, has been infusing appropriately.  Goal of Therapy:  Heparin level 0.3-0.7 units/ml Monitor platelets by anticoagulation protocol: Yes   Plan:  -Increase heparin to 1850 units/hr -Daily HL, CBC -Check level in the morning   Jennafer Gladue, Darl HouseholderAlison M 09/07/2017,1:56 AM

## 2017-09-07 NOTE — Progress Notes (Addendum)
ANTICOAGULATION CONSULT NOTE  Pharmacy Consult for Heparin Indication: atrial fibrillation  Allergies  Allergen Reactions  . Keflex [Cephalexin] Other (See Comments)    Abdominal pain  . Sulfa Antibiotics Hives    Patient Measurements: Height: 6' (182.9 cm) Weight: 267 lb 9.6 oz (121.4 kg) IBW/kg (Calculated) : 77.6 Heparin Dosing Weight: 105 kg  Vital Signs: Temp: 97.7 F (36.5 C) (01/26 0407) Temp Source: Oral (01/26 0407) BP: 105/87 (01/26 0407) Pulse Rate: 95 (01/26 0407)  Labs: Recent Labs    09/06/17 1435 09/06/17 1850 09/07/17 0022 09/07/17 0626  HGB 17.9*  --   --  16.0  HCT 52.1*  --   --  47.1  PLT 154  --   --  139*  HEPARINUNFRC  --   --  <0.10* <0.10*  CREATININE 1.25* 1.21  --  1.14  TROPONINI  --  0.04* 0.03* <0.03    Estimated Creatinine Clearance: 103.1 mL/min (by C-G formula based on SCr of 1.14 mg/dL).   Medical History: Past Medical History:  Diagnosis Date  . Depression   . Hypertension   . New onset atrial flutter (HCC) 09/06/2017  . OSA (obstructive sleep apnea)    "never ordered mask" (09/06/2017)    Medications:  Not on anticoagulant PTA  Assessment: 51yom presented to ED for SOB. Pharmacy consulted for heparin r/o Afib. Elevated Scr at 1.25. CBC WNL.  Heparin level is undetectable at 1850 units/hr. Heparin infusing appropriately.  Goal of Therapy:  Heparin level 0.3-0.7 units/ml Monitor platelets by anticoagulation protocol: Yes   Plan:  - Rebolus 4000 units - Increase heparin to 2200 units/hr - 6hr HL - Daily HL, CBC - Check level in the morning  Maesyn Frisinger A Janyah Singleterry 09/07/2017,8:00 AM

## 2017-09-07 NOTE — Progress Notes (Signed)
Progress Note  Patient Name: Cory Jenkins Date of Encounter: 09/07/2017  Primary Cardiologist: Dr Katrinka Blazing  Subjective   No chest pain; dyspnea improving.  Inpatient Medications    Scheduled Meds: . aspirin EC  81 mg Oral Daily  . irbesartan  150 mg Oral Daily  . nortriptyline  75 mg Oral Daily  . zolpidem  10 mg Oral QHS   Continuous Infusions: . diltiazem (CARDIZEM) infusion 5 mg/hr (09/07/17 0616)  . diltiazem (CARDIZEM) infusion    . heparin 2,200 Units/hr (09/07/17 7564)   PRN Meds: acetaminophen, ondansetron (ZOFRAN) IV   Vital Signs    Vitals:   09/06/17 2045 09/06/17 2330 09/07/17 0407 09/07/17 0823  BP: 127/89 119/83 105/87 129/84  Pulse: 87 88 95 99  Resp: 15 (!) 25 12 (!) 29  Temp: 98 F (36.7 C) (!) 97.3 F (36.3 C) 97.7 F (36.5 C) 97.6 F (36.4 C)  TempSrc: Oral Axillary Oral Axillary  SpO2: 94% 98% 93% 92%  Weight:   267 lb 9.6 oz (121.4 kg)   Height:        Intake/Output Summary (Last 24 hours) at 09/07/2017 1009 Last data filed at 09/07/2017 0700 Gross per 24 hour  Intake 561.12 ml  Output 1600 ml  Net -1038.88 ml   Filed Weights   09/06/17 1435 09/06/17 1820 09/07/17 0407  Weight: 276 lb (125.2 kg) 270 lb 6.4 oz (122.7 kg) 267 lb 9.6 oz (121.4 kg)    Telemetry    Atrial flutter- Personally Reviewed  Physical Exam   GEN: No acute distress.   Neck: No JVD Cardiac: irregular, tachycardic Respiratory: Clear to auscultation bilaterally. GI: Soft, nontender, non-distended  MS: No edema; No deformity. Neuro:  Nonfocal  Psych: Normal affect   Labs    Chemistry Recent Labs  Lab 09/06/17 1435 09/06/17 1850 09/07/17 0626  NA 138 139 136  K 3.9 3.7 3.9  CL 101 103 104  CO2 23 21* 19*  GLUCOSE 121* 111* 137*  BUN 15 15 16   CREATININE 1.25* 1.21 1.14  CALCIUM 9.8 9.3 8.9  PROT 6.8 6.1*  --   ALBUMIN 4.3 3.9  --   AST 26 24  --   ALT 14* 13*  --   ALKPHOS 92 80  --   BILITOT 1.3* 1.3*  --   GFRNONAA >60 >60 >60  GFRAA  >60 >60 >60  ANIONGAP 14 15 13      Hematology Recent Labs  Lab 09/06/17 1435 09/07/17 0626  WBC 12.1* 12.3*  RBC 5.56 5.05  HGB 17.9* 16.0  HCT 52.1* 47.1  MCV 93.7 93.3  MCH 32.2 31.7  MCHC 34.4 34.0  RDW 14.7 14.6  PLT 154 139*    Cardiac Enzymes Recent Labs  Lab 09/06/17 1850 09/07/17 0022 09/07/17 0626  TROPONINI 0.04* 0.03* <0.03    BNP Recent Labs  Lab 09/06/17 1850  BNP 842.0*     DDimer  Recent Labs  Lab 09/06/17 1435  DDIMER 1.37*     Radiology    Dg Chest 2 View  Result Date: 09/06/2017 CLINICAL DATA:  Tachycardia pneumonia EXAM: CHEST  2 VIEW COMPARISON:  01/06/2016 FINDINGS: Stable cardiomegaly. No aortic aneurysm. No pneumonic consolidations. On the lateral view there is mild increased interstitial edema and mild vascular congestion. Minimal blunting of the posterior costophrenic angles may be related to trace effusions versus pleural thickening. No acute osseous abnormality. IMPRESSION: On the lateral view there appears to be mild interstitial edema and vascular congestion with  small posterior pleural effusions. No alveolar consolidations. Stable cardiomegaly. Electronically Signed   By: Tollie Ethavid  Kwon M.D.   On: 09/06/2017 16:26    Patient Profile     52 y.o. male admitted with newly diagnosed atrial flutter and cardiomyopathy.  Preliminary look echocardiogram showed ejection fraction 20-25% with final report pending.  Assessment & Plan    1 Atrial flutter-patient presents with new onset atrial flutter and likely has a tachycardia mediated cardiomyopathy.  We will continue with IV Cardizem for now.  Given reduced LV function will transition to beta-blocker following cardioversion.  We will begin apixaban 5 mg twice daily and discontinue heparin.  Plan TEE guided cardioversion Monday if schedule allows.  Will review with electrophysiology.  Question candidate for ablation.  2 cardiomyopathy-etiology unclear.  TSH is normal.  No history of alcohol  abuse.  In setting of atrial flutter likely tachycardia mediated.  Will change Cardizem to beta-blocker once sinus reestablished.  Continue ARB.  Would plan to repeat echocardiogram 6-8 weeks following cardioversion.  If LV function has not improved would need cardiac catheterization.  3 acute systolic congestive heart failure-we will continue diuresis.  Follow renal function.  4 tobacco abuse-patient counseled on discontinuing.  5 Hypertension-blood pressure is controlled.  6 obstructive sleep apnea-will need fu as outpt  For questions or updates, please contact [facility] Please consult [email or web address] for contact info under Cardiology/STEMI.      Signed, Olga MillersBrian Kaili Castille, MD  09/07/2017, 10:09 AM

## 2017-09-08 LAB — CBC
HEMATOCRIT: 47.2 % (ref 39.0–52.0)
Hemoglobin: 15.7 g/dL (ref 13.0–17.0)
MCH: 31.4 pg (ref 26.0–34.0)
MCHC: 33.3 g/dL (ref 30.0–36.0)
MCV: 94.4 fL (ref 78.0–100.0)
PLATELETS: 129 10*3/uL — AB (ref 150–400)
RBC: 5 MIL/uL (ref 4.22–5.81)
RDW: 14.8 % (ref 11.5–15.5)
WBC: 10.7 10*3/uL — AB (ref 4.0–10.5)

## 2017-09-08 LAB — BASIC METABOLIC PANEL
ANION GAP: 10 (ref 5–15)
BUN: 19 mg/dL (ref 6–20)
CALCIUM: 8.9 mg/dL (ref 8.9–10.3)
CO2: 25 mmol/L (ref 22–32)
CREATININE: 1.3 mg/dL — AB (ref 0.61–1.24)
Chloride: 102 mmol/L (ref 101–111)
GFR calc Af Amer: >60 mL/min (ref >60)
GLUCOSE: 156 mg/dL — AB (ref 65–99)
Potassium: 3.8 mmol/L (ref 3.5–5.1)
Sodium: 137 mmol/L (ref 135–145)

## 2017-09-08 MED ORDER — DIGOXIN 125 MCG PO TABS
0.1250 mg | ORAL_TABLET | Freq: Every day | ORAL | Status: DC
  Administered 2017-09-08 – 2017-09-10 (×3): 0.125 mg via ORAL
  Filled 2017-09-08 (×3): qty 1

## 2017-09-08 MED ORDER — CARVEDILOL 3.125 MG PO TABS
3.1250 mg | ORAL_TABLET | Freq: Once | ORAL | Status: AC
  Administered 2017-09-08: 3.125 mg via ORAL
  Filled 2017-09-08: qty 1

## 2017-09-08 MED ORDER — SPIRONOLACTONE 12.5 MG HALF TABLET
12.5000 mg | ORAL_TABLET | Freq: Every day | ORAL | Status: DC
  Administered 2017-09-08 – 2017-09-10 (×3): 12.5 mg via ORAL
  Filled 2017-09-08 (×3): qty 1

## 2017-09-08 MED ORDER — SACUBITRIL-VALSARTAN 24-26 MG PO TABS
1.0000 | ORAL_TABLET | Freq: Two times a day (BID) | ORAL | Status: DC
  Administered 2017-09-09 – 2017-09-10 (×3): 1 via ORAL
  Filled 2017-09-08 (×3): qty 1

## 2017-09-08 MED ORDER — DIGOXIN 0.25 MG/ML IJ SOLN
0.2500 mg | Freq: Once | INTRAMUSCULAR | Status: AC
  Administered 2017-09-08: 0.25 mg via INTRAVENOUS
  Filled 2017-09-08: qty 2

## 2017-09-08 MED ORDER — CARVEDILOL 3.125 MG PO TABS
3.1250 mg | ORAL_TABLET | Freq: Two times a day (BID) | ORAL | Status: DC
  Administered 2017-09-08: 3.125 mg via ORAL
  Filled 2017-09-08 (×2): qty 1

## 2017-09-08 NOTE — Progress Notes (Signed)
Progress Note  Patient Name: Cory Jenkins Date of Encounter: 09/08/2017  Primary Cardiologist: Dr Katrinka BlazingSmith  Subjective   No CP; dyspnea improving  Inpatient Medications    Scheduled Meds: . apixaban  5 mg Oral BID  . furosemide  20 mg Intravenous BID  . irbesartan  150 mg Oral Daily  . nortriptyline  75 mg Oral Daily  . zolpidem  10 mg Oral QHS   Continuous Infusions: . diltiazem (CARDIZEM) infusion 5 mg/hr (09/08/17 0245)  . diltiazem (CARDIZEM) infusion     PRN Meds: acetaminophen, ondansetron (ZOFRAN) IV   Vital Signs    Vitals:   09/07/17 1958 09/08/17 0031 09/08/17 0516 09/08/17 0804  BP: (!) 131/97 (!) 114/92 (!) 119/105 (!) 152/79  Pulse: 84 100  100  Resp: (!) 26 20 17 18   Temp: 98.7 F (37.1 C) 98.4 F (36.9 C) 97.6 F (36.4 C) 98.4 F (36.9 C)  TempSrc: Oral Oral Oral Oral  SpO2: 95% 96% 90% 98%  Weight:   264 lb 12.8 oz (120.1 kg)   Height:        Intake/Output Summary (Last 24 hours) at 09/08/2017 0914 Last data filed at 09/08/2017 0805 Gross per 24 hour  Intake 240 ml  Output 1825 ml  Net -1585 ml   Filed Weights   09/06/17 1820 09/07/17 0407 09/08/17 0516  Weight: 270 lb 6.4 oz (122.7 kg) 267 lb 9.6 oz (121.4 kg) 264 lb 12.8 oz (120.1 kg)    Telemetry    Atrial flutter with mildly elevated HR- Personally Reviewed  Physical Exam   GEN: WD, mildly obese, NAD Neck: supple Cardiac: irregular and tachycardic Respiratory: CTA; no wheeze GI: Soft, nontender, non-distended, no masses MS: No edema Neuro:  grossly intact   Labs    Chemistry Recent Labs  Lab 09/06/17 1435 09/06/17 1850 09/07/17 0626  NA 138 139 136  K 3.9 3.7 3.9  CL 101 103 104  CO2 23 21* 19*  GLUCOSE 121* 111* 137*  BUN 15 15 16   CREATININE 1.25* 1.21 1.14  CALCIUM 9.8 9.3 8.9  PROT 6.8 6.1*  --   ALBUMIN 4.3 3.9  --   AST 26 24  --   ALT 14* 13*  --   ALKPHOS 92 80  --   BILITOT 1.3* 1.3*  --   GFRNONAA >60 >60 >60  GFRAA >60 >60 >60  ANIONGAP 14  15 13      Hematology Recent Labs  Lab 09/06/17 1435 09/07/17 0626 09/08/17 0730  WBC 12.1* 12.3* 10.7*  RBC 5.56 5.05 5.00  HGB 17.9* 16.0 15.7  HCT 52.1* 47.1 47.2  MCV 93.7 93.3 94.4  MCH 32.2 31.7 31.4  MCHC 34.4 34.0 33.3  RDW 14.7 14.6 14.8  PLT 154 139* 129*    Cardiac Enzymes Recent Labs  Lab 09/06/17 1850 09/07/17 0022 09/07/17 0626  TROPONINI 0.04* 0.03* <0.03    BNP Recent Labs  Lab 09/06/17 1850  BNP 842.0*     DDimer  Recent Labs  Lab 09/06/17 1435  DDIMER 1.37*     Radiology    Dg Chest 2 View  Result Date: 09/06/2017 CLINICAL DATA:  Tachycardia pneumonia EXAM: CHEST  2 VIEW COMPARISON:  01/06/2016 FINDINGS: Stable cardiomegaly. No aortic aneurysm. No pneumonic consolidations. On the lateral view there is mild increased interstitial edema and mild vascular congestion. Minimal blunting of the posterior costophrenic angles may be related to trace effusions versus pleural thickening. No acute osseous abnormality. IMPRESSION: On the lateral  view there appears to be mild interstitial edema and vascular congestion with small posterior pleural effusions. No alveolar consolidations. Stable cardiomegaly. Electronically Signed   By: Tollie Eth M.D.   On: 09/06/2017 16:26    Patient Profile     52 y.o. male admitted with newly diagnosed atrial flutter and cardiomyopathy.  Echo shows severely reduced LV function; mild to moderate MR; moderate TR; moderately reduced RV function; small pericardial effusion.   Assessment & Plan    1 Atrial flutter-pt presented with new onset atrial flutter and possible tachycardia mediated cardiomyopathy.  Given severely reduced LV function, will DC cardizem and treat with digoxin and low dose coreg; follow HR and adjust regimen as needed. Continue apixaban 5 mg twice daily.  Plan TEE guided cardioversion in AM if schedule allows.  Reviewed with Dr Ladona Ridgel; rhythm could possibly be ablated; will fu with EP as outpt.  2  cardiomyopathy-etiology unclear.  TSH is normal.  No history of alcohol abuse. Had URI in Dec; ? Viral cardiomyopathy.  In setting of atrial flutter possibly tachycardia mediated. Will DC cardizem and treat with coreg 3.125 mg BID; add digoxin. DC ARB and add entresto 24/26 BID in AM. Would plan to repeat echocardiogram 6-8 weeks following cardioversion.  If LV function has not improved would need cardiac catheterization and probable cardiac MRI.  3 acute systolic congestive heart failure-will continue lasix at present dose; add spironolactone 12.5 mg daily.  4 tobacco abuse-patient previously counseled on discontinuing.  5 Hypertension-blood pressure is controlled; med changes as outlined.  6 obstructive sleep apnea-will need fu as outpt  7 elevated hgb AIC-will need fu with primary care following DC.  8 mild thrombocytopenia-follow plt count; was on heparin previously.  For questions or updates, please contact [facility] Please consult [email or web address] for contact info under Cardiology/STEMI.      Signed, Olga Millers, MD  09/08/2017, 9:14 AM

## 2017-09-08 NOTE — Progress Notes (Addendum)
Heart rate increase to 1 teens to 120's.  Dr Jens Somrenshaw notified.  Order received to give first dose of coreg now and 0.25mg  iv digoxin ordered x 1.  Will continue to monitor.

## 2017-09-09 ENCOUNTER — Inpatient Hospital Stay (HOSPITAL_COMMUNITY): Payer: BLUE CROSS/BLUE SHIELD

## 2017-09-09 ENCOUNTER — Inpatient Hospital Stay (HOSPITAL_COMMUNITY): Payer: BLUE CROSS/BLUE SHIELD | Admitting: Anesthesiology

## 2017-09-09 ENCOUNTER — Encounter (HOSPITAL_COMMUNITY): Payer: Self-pay

## 2017-09-09 ENCOUNTER — Encounter (HOSPITAL_COMMUNITY)
Admission: EM | Disposition: A | Discharge status: Home / Self Care | Payer: Self-pay | Source: Ambulatory Visit | Attending: Interventional Cardiology

## 2017-09-09 DIAGNOSIS — I340 Nonrheumatic mitral (valve) insufficiency: Secondary | ICD-10-CM

## 2017-09-09 DIAGNOSIS — I429 Cardiomyopathy, unspecified: Secondary | ICD-10-CM

## 2017-09-09 HISTORY — PX: TEE WITHOUT CARDIOVERSION: SHX5443

## 2017-09-09 HISTORY — PX: CARDIOVERSION: SHX1299

## 2017-09-09 LAB — BASIC METABOLIC PANEL
ANION GAP: 10 (ref 5–15)
BUN: 18 mg/dL (ref 6–20)
CALCIUM: 8.8 mg/dL — AB (ref 8.9–10.3)
CO2: 22 mmol/L (ref 22–32)
CREATININE: 0.97 mg/dL (ref 0.61–1.24)
Chloride: 105 mmol/L (ref 101–111)
GFR calc non Af Amer: >60 mL/min (ref >60)
Glucose, Bld: 118 mg/dL — ABNORMAL HIGH (ref 65–99)
Potassium: 3.9 mmol/L (ref 3.5–5.1)
SODIUM: 137 mmol/L (ref 135–145)

## 2017-09-09 LAB — CBC
HCT: 48.4 % (ref 39.0–52.0)
Hemoglobin: 16.2 g/dL (ref 13.0–17.0)
MCH: 31.6 pg (ref 26.0–34.0)
MCHC: 33.5 g/dL (ref 30.0–36.0)
MCV: 94.5 fL (ref 78.0–100.0)
PLATELETS: 125 10*3/uL — AB (ref 150–400)
RBC: 5.12 MIL/uL (ref 4.22–5.81)
RDW: 14.8 % (ref 11.5–15.5)
WBC: 9 10*3/uL (ref 4.0–10.5)

## 2017-09-09 SURGERY — ECHOCARDIOGRAM, TRANSESOPHAGEAL
Anesthesia: General

## 2017-09-09 MED ORDER — METOPROLOL TARTRATE 5 MG/5ML IV SOLN
INTRAVENOUS | Status: DC | PRN
  Administered 2017-09-09: 5 mg via INTRAVENOUS

## 2017-09-09 MED ORDER — LACTATED RINGERS IV SOLN
INTRAVENOUS | Status: DC
  Administered 2017-09-09: 11:00:00 via INTRAVENOUS

## 2017-09-09 MED ORDER — METOPROLOL TARTRATE 5 MG/5ML IV SOLN
INTRAVENOUS | Status: AC
  Filled 2017-09-09: qty 5

## 2017-09-09 MED ORDER — LIDOCAINE 2% (20 MG/ML) 5 ML SYRINGE
INTRAMUSCULAR | Status: DC | PRN
  Administered 2017-09-09: 40 mg via INTRAVENOUS

## 2017-09-09 MED ORDER — SODIUM CHLORIDE 0.9 % IV SOLN
INTRAVENOUS | Status: DC
  Administered 2017-09-09: 09:00:00 via INTRAVENOUS

## 2017-09-09 MED ORDER — BUTAMBEN-TETRACAINE-BENZOCAINE 2-2-14 % EX AERO
INHALATION_SPRAY | CUTANEOUS | Status: DC | PRN
  Administered 2017-09-09: 2 via TOPICAL

## 2017-09-09 MED ORDER — PROPOFOL 10 MG/ML IV BOLUS
INTRAVENOUS | Status: DC | PRN
  Administered 2017-09-09 (×3): 10 mg via INTRAVENOUS

## 2017-09-09 MED ORDER — CARVEDILOL 6.25 MG PO TABS
6.2500 mg | ORAL_TABLET | Freq: Two times a day (BID) | ORAL | Status: DC
  Administered 2017-09-09 – 2017-09-10 (×3): 6.25 mg via ORAL
  Filled 2017-09-09 (×3): qty 1

## 2017-09-09 MED ORDER — DEXMEDETOMIDINE HCL IN NACL 200 MCG/50ML IV SOLN
INTRAVENOUS | Status: DC | PRN
  Administered 2017-09-09: 4 ug via INTRAVENOUS
  Administered 2017-09-09: 12 ug via INTRAVENOUS

## 2017-09-09 MED ORDER — ETOMIDATE 2 MG/ML IV SOLN
INTRAVENOUS | Status: DC | PRN
  Administered 2017-09-09: 6 mg via INTRAVENOUS

## 2017-09-09 NOTE — Interval H&P Note (Signed)
History and Physical Interval Note:  09/09/2017 11:29 AM  [name]  has presented today for surgery, with the diagnosis of Atrial flutter  The various methods of treatment have been discussed with the patient and family. After consideration of risks, benefits and other options for treatment, the patient has consented to  Procedure(s): TRANSESOPHAGEAL ECHOCARDIOGRAM (TEE) (N/A) CARDIOVERSION (N/A) as a surgical intervention .  The patient's history has been reviewed, patient examined, no change in status, stable for surgery.  I have reviewed the patient's chart and labs.  Questions were answered to the patient's satisfaction.     Charlton HawsPeter Brytnee Bechler

## 2017-09-09 NOTE — Care Management Note (Addendum)
Case Management Note  Patient Details  Name: Cory Jenkins MRN: [id number] Date of Birth: 1965-10-26  Subjective/Objective: Pt presented for new onset atrial flutter. Plan for Entresto and Eliquis for home.                   Action/Plan: Benefits Check in process- will make pt aware of cost once completed. No further needs from CM at this time.   Expected Discharge Date:                  Expected Discharge Plan:  Home/Self Care  In-House Referral:  NA  Discharge planning Services  CM Consult  Post Acute Care Choice:  NA Choice offered to:  NA  DME Arranged:  N/A DME Agency:  NA  HH Arranged:  NA HH Agency:  NA  Status of Service:  Completed, signed off  If discussed at [place] of Stay Meetings, dates discussed:    Additional Comments: 1029 09-10-17 Jacqlyn Krauss, RN,BSN [phone] CM did provide pt with 30 day free Eliquis and Entresto Cards along with both co pay cards. Pt uses [facility] in [place] and both Eliquis and Entresto are available. No further needs from CM at this time.     3154 09-10-17 Jacqlyn Krauss, RN,BSN [phone]      . ELIQUIS 5 MG BID  COVER- YES  CO-PAY- DEDUCTIBLE NOT MET  TIER- 2 DRUG  PRIOR APPROVAL- NO   2. ENTRESTO 24-26 MG BID  COVER- NONE FORMULARY  PRIOR APPROVAL- YES # [phone]   3. SACUBITRIL-VALSARTAN  [place] # [phone]   DEDUCTIBLE : NOT MET / $ 5,000.00   PREFERRED PHARMACY : [facility]  Bethena Roys, RN 09/09/2017, 11:04 AM

## 2017-09-09 NOTE — Anesthesia Postprocedure Evaluation (Signed)
Anesthesia Post Note  Patient: Thereasa Sololfred B Hollinshead  Procedure(s) Performed: TRANSESOPHAGEAL ECHOCARDIOGRAM (TEE) (N/A ) CARDIOVERSION (N/A )     Patient location during evaluation: PACU Anesthesia Type: General Level of consciousness: awake and alert Pain management: pain level controlled Vital Signs Assessment: post-procedure vital signs reviewed and stable Respiratory status: spontaneous breathing, nonlabored ventilation, respiratory function stable and patient connected to nasal cannula oxygen Cardiovascular status: blood pressure returned to baseline and stable Postop Assessment: no apparent nausea or vomiting Anesthetic complications: no    Last Vitals:  Vitals:   09/09/17 1230 09/09/17 1236  BP:  115/80  Pulse: 92 91  Resp: 20 17  Temp:    SpO2: 97% 91%    Last Pain:  Vitals:   09/09/17 1209  TempSrc: Oral  PainSc:                  Ryan P Ellender

## 2017-09-09 NOTE — Transfer of Care (Signed)
Immediate Anesthesia Transfer of Care Note  Patient: Cory Jenkins  Procedure(s) Performed: TRANSESOPHAGEAL ECHOCARDIOGRAM (TEE) (N/A ) CARDIOVERSION (N/A )  Patient Location: Endoscopy Unit  Anesthesia Type:MAC  Level of Consciousness: awake, alert  and oriented  Airway & Oxygen Therapy: Patient Spontanous Breathing and Patient connected to nasal cannula oxygen  Post-op Assessment: Report given to RN and Post -op Vital signs reviewed and stable  Post vital signs: Reviewed and stable  Last Vitals:  Vitals:   09/09/17 1101 09/09/17 1209  BP: (!) 144/110 107/73  Pulse: (!) 134 84  Resp: 15 15  Temp: (!) 36.4 C   SpO2: 95% 98%    Last Pain:  Vitals:   09/09/17 1101  TempSrc: Oral  PainSc:       Patients Stated Pain Goal: 0 (89/16/94 5038)  Complications: No apparent anesthesia complications

## 2017-09-09 NOTE — Anesthesia Preprocedure Evaluation (Addendum)
Anesthesia Evaluation    Airway Mallampati: II  TM Distance: >3 FB Neck ROM: Full    Dental no notable dental hx.    Pulmonary sleep apnea , Current Smoker,    Pulmonary exam normal breath sounds clear to auscultation       Cardiovascular hypertension, Pt. on medications and Pt. on home beta blockers +CHF  Normal cardiovascular exam+ dysrhythmias Atrial Fibrillation  Rhythm:Regular Rate:Normal  ECG: A-flutter, rate 92  ECHO: LV EF: 10% -   15%   Neuro/Psych PSYCHIATRIC DISORDERS Anxiety Depression negative neurological ROS     GI/Hepatic negative GI ROS, Neg liver ROS,   Endo/Other  negative endocrine ROS  Renal/GU negative Renal ROS     Musculoskeletal   Abdominal (+) + obese,   Peds  Hematology negative hematology ROS (+)   Anesthesia Other Findings   Reproductive/Obstetrics                         Anesthesia Physical Anesthesia Plan  ASA: III  Anesthesia Plan: General   Post-op Pain Management:    Induction: Intravenous  PONV Risk Score and Plan: 1 and Treatment may vary due to age or medical condition  Airway Management Planned: Natural Airway  Additional Equipment:   Intra-op Plan:   Post-operative Plan:   Informed Consent: I have reviewed the patients History and Physical, chart, labs and discussed the procedure including the risks, benefits and alternatives for the proposed anesthesia with the patient or authorized representative who has indicated his/her understanding and acceptance.   Dental advisory given  Plan Discussed with: CRNA  Anesthesia Plan Comments:        Anesthesia Quick Evaluation

## 2017-09-09 NOTE — CV Procedure (Signed)
TEE/DCC Anesthesia: Propofol, Etomidate, Precedex  EF 20-25% Mild MR No LAA thrombus No ASD/PFO Normal RV No effusion   DCC x 1 at 120 Joules converted from atrial flutter rate 123 to SR/ST rates 98 No immediate neurologic sequelae On Eliquis   [employer] Financial CorporationPeter Betsaida [place]

## 2017-09-09 NOTE — H&P (View-Only) (Signed)
Progress Note  Patient Name: Cory Jenkins Date of Encounter: 09/09/2017  Primary Cardiologist: Dr Katrinka Blazing  Subjective   Denies dyspnea or chest pain  Inpatient Medications    Scheduled Meds: . apixaban  5 mg Oral BID  . carvedilol  3.125 mg Oral BID WC  . digoxin  0.125 mg Oral Daily  . furosemide  20 mg Intravenous BID  . nortriptyline  75 mg Oral Daily  . sacubitril-valsartan  1 tablet Oral BID  . spironolactone  12.5 mg Oral Daily  . zolpidem  10 mg Oral QHS   Continuous Infusions:  PRN Meds: acetaminophen, ondansetron (ZOFRAN) IV   Vital Signs    Vitals:   09/08/17 1326 09/08/17 1817 09/08/17 2037 09/09/17 0540  BP: (!) 124/95 126/83 (!) 129/98 (!) 132/97  Pulse: (!) 111 (!) 115 60 (!) 128  Resp:   20 (!) 24  Temp:   98 F (36.7 C) (!) 97.5 F (36.4 C)  TempSrc:   Oral Oral  SpO2:   96% 95%  Weight:    263 lb 3.2 oz (119.4 kg)  Height:        Intake/Output Summary (Last 24 hours) at 09/09/2017 0737 Last data filed at 09/09/2017 0541 Gross per 24 hour  Intake 564.83 ml  Output 1950 ml  Net -1385.17 ml   Filed Weights   09/07/17 0407 09/08/17 0516 09/09/17 0540  Weight: 267 lb 9.6 oz (121.4 kg) 264 lb 12.8 oz (120.1 kg) 263 lb 3.2 oz (119.4 kg)    Telemetry    Atrial flutter with mildly elevated HR- Personally Reviewed  Physical Exam   GEN: WD, obese NAD Neck: supple, no JVD Cardiac: irregular and tachycardic Respiratory: CTA GI: Soft,NT/ND MS: No edema Neuro:  no focal findings   Labs    Chemistry Recent Labs  Lab 09/06/17 1435 09/06/17 1850 09/07/17 0626 09/08/17 0730  NA 138 139 136 137  K 3.9 3.7 3.9 3.8  CL 101 103 104 102  CO2 23 21* 19* 25  GLUCOSE 121* 111* 137* 156*  BUN 15 15 16 19   CREATININE 1.25* 1.21 1.14 1.30*  CALCIUM 9.8 9.3 8.9 8.9  PROT 6.8 6.1*  --   --   ALBUMIN 4.3 3.9  --   --   AST 26 24  --   --   ALT 14* 13*  --   --   ALKPHOS 92 80  --   --   BILITOT 1.3* 1.3*  --   --   GFRNONAA >60 >60 >60  >60  GFRAA >60 >60 >60 >60  ANIONGAP 14 15 13 10      Hematology Recent Labs  Lab 09/06/17 1435 09/07/17 0626 09/08/17 0730  WBC 12.1* 12.3* 10.7*  RBC 5.56 5.05 5.00  HGB 17.9* 16.0 15.7  HCT 52.1* 47.1 47.2  MCV 93.7 93.3 94.4  MCH 32.2 31.7 31.4  MCHC 34.4 34.0 33.3  RDW 14.7 14.6 14.8  PLT 154 139* 129*    Cardiac Enzymes Recent Labs  Lab 09/06/17 1850 09/07/17 0022 09/07/17 0626  TROPONINI 0.04* 0.03* <0.03    BNP Recent Labs  Lab 09/06/17 1850  BNP 842.0*     DDimer  Recent Labs  Lab 09/06/17 1435  DDIMER 1.37*     Patient Profile     52 y.o. male admitted with newly diagnosed atrial flutter and cardiomyopathy.  Echo shows severely reduced LV function; mild to moderate MR; moderate TR; moderately reduced RV function; small pericardial effusion.  Assessment & Plan    1 Atrial flutter-pt remains in atrial flutter this AM; also with possible tachycardia mediated cardiomyopathy.  Continue digoxin; increase coreg to 6.25 mg BID for rate control. Continue apixaban 5 mg twice daily (CHADSvasc 2 for HTN and CHF).  Plan TEE guided cardioversion today.  Reviewed with Dr Ladona Ridgelaylor; rhythm could possibly be ablated; will fu with EP as outpt.  2 cardiomyopathy-etiology unclear.  TSH is normal.  No history of alcohol abuse. Had URI in Dec; ? Viral cardiomyopathy.  In setting of atrial flutter possibly tachycardia mediated. Continue digoxin; increase coreg to 6.25 mg BID; begin entresto 24/26 BID this AM. Will plan to repeat echocardiogram 6-8 weeks following cardioversion.  If LV function has not improved would need cardiac catheterization and probable cardiac MRI.  3 acute systolic congestive heart failure-symptoms improved; continue present dose of lasix and spironolactone.  4 tobacco abuse-patient previously counseled on discontinuing.  5 Hypertension-BP controlled; follow with addition of entresto.  6 obstructive sleep apnea-will need fu as outpt  7 elevated  hgb AIC-will need fu with primary care following DC.  8 mild thrombocytopenia-follow plt count; was on heparin previously. Repeat labs pending.  For questions or updates, please contact [facility] Please consult [email or web address] for contact info under Cardiology/STEMI.      Signed, Olga MillersBrian Crenshaw, MD  09/09/2017, 7:37 AM

## 2017-09-09 NOTE — Progress Notes (Addendum)
Progress Note  Patient Name: Cory Jenkins Date of Encounter: 09/09/2017  Primary Cardiologist: Dr Katrinka Blazing  Subjective   Denies dyspnea or chest pain  Inpatient Medications    Scheduled Meds: . apixaban  5 mg Oral BID  . carvedilol  3.125 mg Oral BID WC  . digoxin  0.125 mg Oral Daily  . furosemide  20 mg Intravenous BID  . nortriptyline  75 mg Oral Daily  . sacubitril-valsartan  1 tablet Oral BID  . spironolactone  12.5 mg Oral Daily  . zolpidem  10 mg Oral QHS   Continuous Infusions:  PRN Meds: acetaminophen, ondansetron (ZOFRAN) IV   Vital Signs    Vitals:   09/08/17 1326 09/08/17 1817 09/08/17 2037 09/09/17 0540  BP: (!) 124/95 126/83 (!) 129/98 (!) 132/97  Pulse: (!) 111 (!) 115 60 (!) 128  Resp:   20 (!) 24  Temp:   98 F (36.7 C) (!) 97.5 F (36.4 C)  TempSrc:   Oral Oral  SpO2:   96% 95%  Weight:    263 lb 3.2 oz (119.4 kg)  Height:        Intake/Output Summary (Last 24 hours) at 09/09/2017 0737 Last data filed at 09/09/2017 0541 Gross per 24 hour  Intake 564.83 ml  Output 1950 ml  Net -1385.17 ml   Filed Weights   09/07/17 0407 09/08/17 0516 09/09/17 0540  Weight: 267 lb 9.6 oz (121.4 kg) 264 lb 12.8 oz (120.1 kg) 263 lb 3.2 oz (119.4 kg)    Telemetry    Atrial flutter with mildly elevated HR- Personally Reviewed  Physical Exam   GEN: WD, obese NAD Neck: supple, no JVD Cardiac: irregular and tachycardic Respiratory: CTA GI: Soft,NT/ND MS: No edema Neuro:  no focal findings   Labs    Chemistry Recent Labs  Lab 09/06/17 1435 09/06/17 1850 09/07/17 0626 09/08/17 0730  NA 138 139 136 137  K 3.9 3.7 3.9 3.8  CL 101 103 104 102  CO2 23 21* 19* 25  GLUCOSE 121* 111* 137* 156*  BUN 15 15 16 19   CREATININE 1.25* 1.21 1.14 1.30*  CALCIUM 9.8 9.3 8.9 8.9  PROT 6.8 6.1*  --   --   ALBUMIN 4.3 3.9  --   --   AST 26 24  --   --   ALT 14* 13*  --   --   ALKPHOS 92 80  --   --   BILITOT 1.3* 1.3*  --   --   GFRNONAA >60 >60 >60  >60  GFRAA >60 >60 >60 >60  ANIONGAP 14 15 13 10      Hematology Recent Labs  Lab 09/06/17 1435 09/07/17 0626 09/08/17 0730  WBC 12.1* 12.3* 10.7*  RBC 5.56 5.05 5.00  HGB 17.9* 16.0 15.7  HCT 52.1* 47.1 47.2  MCV 93.7 93.3 94.4  MCH 32.2 31.7 31.4  MCHC 34.4 34.0 33.3  RDW 14.7 14.6 14.8  PLT 154 139* 129*    Cardiac Enzymes Recent Labs  Lab 09/06/17 1850 09/07/17 0022 09/07/17 0626  TROPONINI 0.04* 0.03* <0.03    BNP Recent Labs  Lab 09/06/17 1850  BNP 842.0*     DDimer  Recent Labs  Lab 09/06/17 1435  DDIMER 1.37*     Patient Profile     52 y.o. male admitted with newly diagnosed atrial flutter and cardiomyopathy.  Echo shows severely reduced LV function; mild to moderate MR; moderate TR; moderately reduced RV function; small pericardial effusion.  Assessment & Plan    1 Atrial flutter-pt remains in atrial flutter this AM; also with possible tachycardia mediated cardiomyopathy.  Continue digoxin; increase coreg to 6.25 mg BID for rate control. Continue apixaban 5 mg twice daily (CHADSvasc 2 for HTN and CHF).  Plan TEE guided cardioversion today.  Reviewed with Dr Ladona Ridgelaylor; rhythm could possibly be ablated; will fu with EP as outpt.  2 cardiomyopathy-etiology unclear.  TSH is normal.  No history of alcohol abuse. Had URI in Dec; ? Viral cardiomyopathy.  In setting of atrial flutter possibly tachycardia mediated. Continue digoxin; increase coreg to 6.25 mg BID; begin entresto 24/26 BID this AM. Will plan to repeat echocardiogram 6-8 weeks following cardioversion.  If LV function has not improved would need cardiac catheterization and probable cardiac MRI.  3 acute systolic congestive heart failure-symptoms improved; continue present dose of lasix and spironolactone.  4 tobacco abuse-patient previously counseled on discontinuing.  5 Hypertension-BP controlled; follow with addition of entresto.  6 obstructive sleep apnea-will need fu as outpt  7 elevated  hgb AIC-will need fu with primary care following DC.  8 mild thrombocytopenia-follow plt count; was on heparin previously. Repeat labs pending.  For questions or updates, please contact [facility] Please consult [email or web address] for contact info under Cardiology/STEMI.      Signed, Olga MillersBrian Lamoyne Hessel, MD  09/09/2017, 7:37 AM

## 2017-09-10 ENCOUNTER — Telehealth: Payer: Self-pay | Admitting: *Deleted

## 2017-09-10 LAB — CBC
HEMATOCRIT: 49.5 % (ref 39.0–52.0)
HEMOGLOBIN: 17.3 g/dL — AB (ref 13.0–17.0)
MCH: 32.9 pg (ref 26.0–34.0)
MCHC: 34.9 g/dL (ref 30.0–36.0)
MCV: 94.1 fL (ref 78.0–100.0)
Platelets: 142 10*3/uL — ABNORMAL LOW (ref 150–400)
RBC: 5.26 MIL/uL (ref 4.22–5.81)
RDW: 14.3 % (ref 11.5–15.5)
WBC: 9.8 10*3/uL (ref 4.0–10.5)

## 2017-09-10 LAB — BASIC METABOLIC PANEL
ANION GAP: 11 (ref 5–15)
BUN: 19 mg/dL (ref 6–20)
CALCIUM: 9 mg/dL (ref 8.9–10.3)
CHLORIDE: 104 mmol/L (ref 101–111)
CO2: 24 mmol/L (ref 22–32)
Creatinine, Ser: 1.18 mg/dL (ref 0.61–1.24)
GFR calc non Af Amer: >60 mL/min (ref >60)
Glucose, Bld: 123 mg/dL — ABNORMAL HIGH (ref 65–99)
Potassium: 4.4 mmol/L (ref 3.5–5.1)
SODIUM: 139 mmol/L (ref 135–145)

## 2017-09-10 MED ORDER — FUROSEMIDE 40 MG PO TABS
40.0000 mg | ORAL_TABLET | Freq: Every day | ORAL | Status: DC
  Administered 2017-09-10: 40 mg via ORAL
  Filled 2017-09-10: qty 1

## 2017-09-10 MED ORDER — APIXABAN 5 MG PO TABS: 5.0000 mg | ORAL_TABLET | Freq: Two times a day (BID) | ORAL | 11 refills | Status: DC

## 2017-09-10 MED ORDER — APIXABAN 5 MG PO TABS: 5.0000 mg | ORAL_TABLET | Freq: Two times a day (BID) | ORAL | 0 refills | Status: DC

## 2017-09-10 MED ORDER — APIXABAN 5 MG PO TABS
5.0000 mg | ORAL_TABLET | Freq: Two times a day (BID) | ORAL | 0 refills | Status: DC
Start: 2017-09-10 — End: 2017-09-10

## 2017-09-10 MED ORDER — FUROSEMIDE 40 MG PO TABS: 40.0000 mg | ORAL_TABLET | Freq: Every day | ORAL | 11 refills | Status: DC

## 2017-09-10 MED ORDER — CARVEDILOL 6.25 MG PO TABS: 6.2500 mg | ORAL_TABLET | Freq: Two times a day (BID) | ORAL | 11 refills | Status: DC

## 2017-09-10 MED ORDER — SACUBITRIL-VALSARTAN 24-26 MG PO TABS: 1.0000 | ORAL_TABLET | Freq: Two times a day (BID) | ORAL | 0 refills | Status: DC

## 2017-09-10 MED ORDER — SACUBITRIL-VALSARTAN 24-26 MG PO TABS: 1.0000 | ORAL_TABLET | Freq: Two times a day (BID) | ORAL | 11 refills | Status: DC

## 2017-09-10 MED ORDER — SPIRONOLACTONE 25 MG PO TABS: 12.5000 mg | ORAL_TABLET | Freq: Every day | ORAL | 11 refills | Status: DC

## 2017-09-10 MED ORDER — DIGOXIN 125 MCG PO TABS: 0.1250 mg | ORAL_TABLET | Freq: Every day | ORAL | 11 refills | Status: DC

## 2017-09-10 NOTE — Telephone Encounter (Signed)
Samples card and samples given

## 2017-09-10 NOTE — Discharge Instructions (Signed)
NO TOBACCO!  Apixaban oral tablets What is this medicine? APIXABAN (a PIX a ban) is an anticoagulant (blood thinner). It is used to lower the chance of stroke in people with a medical condition called atrial fibrillation. It is also used to treat or prevent blood clots in the lungs or in the veins. This medicine may be used for other purposes; ask your health care provider or pharmacist if you have questions. COMMON BRAND NAME(S): Eliquis  What should I tell my health care provider before I take this medicine? They need to know if you have any of these conditions: -bleeding disorders -bleeding in the brain -blood in your stools (black or tarry stools) or if you have blood in your vomit -history of stomach bleeding -kidney disease -liver disease -mechanical heart valve -an unusual or allergic reaction to apixaban, other medicines, foods, dyes, or preservatives -pregnant or trying to get pregnant -breast-feeding How should I use this medicine? Take this medicine by mouth with a glass of water. Follow the directions on the prescription label. You can take it with or without food. If it upsets your stomach, take it with food. Take your medicine at regular intervals. Do not take it more often than directed. Do not stop taking except on your doctor's advice. Stopping this medicine may increase your risk of a blot clot. Be sure to refill your prescription before you run out of medicine. Talk to your pediatrician regarding the use of this medicine in children. Special care may be needed. Overdosage: If you think you have taken too much of this medicine contact a poison control center or emergency room at once. NOTE: This medicine is only for you. Do not share this medicine with others. What if I miss a dose? If you miss a dose, take it as soon as you can. If it is almost time for your next dose, take only that dose. Do not take double or extra doses. What may interact with this medicine? This  medicine may interact with the following: -aspirin and aspirin-like medicines -certain medicines for fungal infections like ketoconazole and itraconazole -certain medicines for seizures like carbamazepine and phenytoin -certain medicines that treat or prevent blood clots like warfarin, enoxaparin, and dalteparin -clarithromycin -NSAIDs, medicines for pain and inflammation, like ibuprofen or naproxen -rifampin -ritonavir -St. John's wort This list may not describe all possible interactions. Give your health care provider a list of all the medicines, herbs, non-prescription drugs, or dietary supplements you use. Also tell them if you smoke, drink alcohol, or use illegal drugs. Some items may interact with your medicine.  What should I watch for while using this medicine? Visit your doctor or health care professional for regular checks on your progress. Notify your doctor or health care professional and seek emergency treatment if you develop breathing problems; changes in vision; chest pain; severe, sudden headache; pain, swelling, warmth in the leg; trouble speaking; sudden numbness or weakness of the face, arm or leg. These can be signs that your condition has gotten worse. If you are going to have surgery or other procedure, tell your doctor that you are taking this medicine. What side effects may I notice from receiving this medicine? Side effects that you should report to your doctor or health care professional as soon as possible: -allergic reactions like skin rash, itching or hives, swelling of the face, lips, or tongue -signs and symptoms of bleeding such as bloody or black, tarry stools; red or dark-brown urine; spitting up blood or brown  material that looks like coffee grounds; red spots on the skin; unusual bruising or bleeding from the eye, gums, or nose This list may not describe all possible side effects. Call your doctor for medical advice about side effects. You may report side  effects to FDA at 1-800-FDA-1088.  Where should I keep my medicine? Keep out of the reach of children. Store at room temperature between 20 and 25 degrees C (68 and 77 degrees F). Throw away any unused medicine after the expiration date. NOTE: This sheet is a summary. It may not cover all possible information. If you have questions about this medicine, talk to your doctor, pharmacist, or health care provider.  2018 Elsevier/Gold Standard (2016-02-20 11:54:23)

## 2017-09-10 NOTE — Discharge Summary (Signed)
Discharge Summary    Cory Jenkins ID: Cory Jenkins,  MRN: [id number], DOB/AGE: Dec 09, 1965 52 y.o.  Admit date: 09/06/2017 Discharge date: 09/10/2017  Primary Care Provider: Patient, No Pcp Per Primary Cardiologist: Olga Millers, MD  Discharge Diagnoses    Principal Problem:   Atrial flutter [facility]) Active Problems:   Snoring   Tobacco use disorder   OSA (obstructive sleep apnea)   Acute diastolic heart failure (HCC)   Allergies Allergies  Allergen Reactions  . Keflex [Cephalexin] Other (See Comments)    Abdominal pain  . Sulfa Antibiotics Hives    Diagnostic Studies/Procedures    TEE with cardioversion: 09/09/2017 Study Conclusions - Left ventricle: Previous gastric banding did not do transgastic   imaging The cavity size was moderately dilated. Systolic function   was severely reduced. The estimated ejection fraction was in the   range of 20% to 25%. Diffuse hypokinesis. No evidence of   thrombus. - Mitral valve: There was mild regurgitation. - Left atrium: The atrium was mildly dilated. No evidence of   thrombus in the atrial cavity or appendage. No evidence of   thrombus in the atrial cavity or appendage. No evidence of   thrombus in the atrial cavity or appendage. - Atrial septum: There was increased thickness of the septum,   consistent with lipomatous hypertrophy. No defect or patent   foramen ovale was identified. - Impressions: DCC x 1 150 J biphasic   Converted from rapid atrial flutter 130 bpm to SR/ST rates 95-100   bpm   No immediate neurologic sequelae. Impressions: - DCC x 1 150 J biphasic   Converted from rapid atrial flutter 130 bpm to SR/ST rates 95-100   bpm   No immediate neurologic sequelae. Successful cardioversion. No   cardiac source of emboli was indentified.  ECHO: 09/06/2017 Study Conclusions - Left ventricle: The cavity size was normal. Wall thickness was   normal. The estimated ejection fraction was in the range of 10%   to  15%. Diffuse hypokinesis. The study is not technically   sufficient to allow evaluation of LV diastolic function. - Regional wall motion abnormality: Akinesis of the basal-mid   anteroseptal myocardium. - Aortic valve: Valve area (VTI): 2.73 cm^2. Valve area (Vmax):   3.02 cm^2. - Mitral valve: There was mild regurgitation. - Left atrium: The atrium was severely dilated. - Right ventricle: The cavity size was mildly dilated. Systolic   function was moderately reduced. - Right atrium: The atrium was moderately dilated. - Atrial septum: No defect or patent foramen ovale was identified. - Tricuspid valve: There was mild-moderate regurgitation. - Pulmonary arteries: Systolic pressure was moderately increased.   PA peak pressure: 56 mm Hg (S). _____________   History of Present Illness     52 y.o. male with a history history of obstructive sleep apnea, essential hypertension, obesity, depression, was admitted 09/06/2017 with newly diagnosed atrial flutter and cardiomyopathy.  Echo shows severely reduced LV function; mild to moderate MR; moderate TR; moderately reduced RV function; small pericardial effusion.   Hospital Course     Consultants: None  He noted increasing orthopnea and dyspnea on exertion and PCP treated him for an upper respiratory infection on 09/03/2017.  Dr. Sherene Sires saw him on the day of admission and felt he was in CHF, sent to the ER.  In the ER, his heart rate was 140.  He was started on IV Cardizem with improvement in rate control.  Once he was otherwise stable, a TEE cardioversion was  performed to get him back in sinus rhythm.  Results are above.  He was successfully cardioverted.  He is maintaining sinus rhythm at discharge.  He is on digoxin and carvedilol. CHADS2VASC= 2, HTN & CHF.  He will see Dr. Ladona Ridgel as an outpatient for consideration of ablation.  An urgent echocardiogram demonstrated an EF of 20-25%.  His systolic dysfunction could be related to tachycardia but  other causes were considered.  RV function was mildly decreased as well.  He was started on IV Lasix and cardiac markers were cycled.  His troponin was only mildly abnormal, more consistent with CHF than ACS.  His BNP was over 800.  A formal echo showed an EF of 10-15%.  His TSH was normal and he has no history of EtOH abuse.  He had an upper respiratory infection in December, this could be viral cardiomyopathy.  Over the course of his hospital stay, he was started on digoxin, carvedilol, Spironolactone, Lasix, and Entresto.  He is to follow-up as an outpatient with a repeat echocardiogram in 6-8 weeks.  If his EF is not improved, he will need cardiac catheterization, probably also a cardiac MRI and possibly an ICD.  He was counseled on stopping smoking and says he believes he will be able to do this.  He had some mild thrombocytopenia, but no bleeding issues and this can be followed as an outpatient.  He was counseled on the importance of treating sleep apnea, he is to follow-up as an outpatient.  On 09/10/2017, he was seen by Dr. Jens Som and all data were reviewed.  Volume status is good, weight 259 pounds.  Vital signs are stable on current meds and he is maintaining sinus rhythm.  No further inpatient workup is indicated he is considered stable for discharge, to have a early follow-up in the office with regular cardiology and with Dr. Ladona Ridgel for EP.  Prior authorization for Eliquis was attempted but denied.  Neither Eliquis nor Xarelto are on [facility] formulary.  Forms are being faxed to the office.  _____________  Discharge Vitals Blood pressure 126/89, pulse 92, temperature 97.9 F (36.6 C), temperature source Oral, resp. rate 18, height 6' (1.829 m), weight 259 lb (117.5 kg), SpO2 98 %.  Filed Weights   09/09/17 0540 09/09/17 1101 09/10/17 0631  Weight: 263 lb 3.2 oz (119.4 kg) 263 lb 3.2 oz (119.4 kg) 259 lb (117.5 kg)    Labs & Radiologic Studies    CBC Recent  Labs    09/09/17 0648 09/10/17 0458  WBC 9.0 9.8  HGB 16.2 17.3*  HCT 48.4 49.5  MCV 94.5 94.1  PLT 125* 142*   Basic Metabolic Panel Recent Labs    16/10/96 0648 09/10/17 0458  NA 137 139  K 3.9 4.4  CL 105 104  CO2 22 24  GLUCOSE 118* 123*  BUN 18 19  CREATININE 0.97 1.18  CALCIUM 8.8* 9.0   Liver Function Tests Lab Results  Component Value Date   ALT 13 (L) 09/06/2017   AST 24 09/06/2017   ALKPHOS 80 09/06/2017   BILITOT 1.3 (H) 09/06/2017   Cardiac Enzymes Troponin I  Date Value Ref Range Status  09/07/2017 <0.03 <0.03 ng/mL Final  09/07/2017 0.03 (HH) <0.03 ng/mL Final    Comment:    CRITICAL VALUE NOTED.  VALUE IS CONSISTENT WITH PREVIOUSLY REPORTED AND CALLED VALUE.  09/06/2017 0.04 (HH) <0.03 ng/mL Final    Comment:    CRITICAL RESULT CALLED TO, READ BACK BY  AND VERIFIED WITHGust Rung RN [phone] 2059 GREEN R    BNP B Natriuretic Peptide  Date Value Ref Range Status  09/06/2017 842.0 (H) 0.0 - 100.0 pg/mL Final   Hemoglobin A1C Lab Results  Component Value Date   HGBA1C 6.4 (H) 09/06/2017    Fasting Lipid Panel Lab Results  Component Value Date   CHOL 142 09/07/2017   HDL 27 (L) 09/07/2017   LDLCALC 96 09/07/2017   TRIG 96 09/07/2017   CHOLHDL 5.3 09/07/2017    Thyroid Function Tests Lab Results  Component Value Date   TSH 2.089 09/06/2017    _____________  Dg Chest 2 View  Result Date: 09/06/2017 CLINICAL DATA:  Tachycardia pneumonia EXAM: CHEST  2 VIEW COMPARISON:  01/06/2016 FINDINGS: Stable cardiomegaly. No aortic aneurysm. No pneumonic consolidations. On the lateral view there is mild increased interstitial edema and mild vascular congestion. Minimal blunting of the posterior costophrenic angles may be related to trace effusions versus pleural thickening. No acute osseous abnormality. IMPRESSION: On the lateral view there appears to be mild interstitial edema and vascular congestion with small posterior pleural effusions. No  alveolar consolidations. Stable cardiomegaly. Electronically Signed   By: Tollie Eth M.D.   On: 09/06/2017 16:26   Disposition   Pt is being discharged home today in good condition.  Follow-up Plans & Appointments    Follow-up Information    Rosalio Macadamia, NP Follow up on 09/17/2017.   Specialties:  Nurse Practitioner, Interventional Cardiology, Cardiology, Radiology Why:  Please arrive at 11:15 AM for an 11:30 AM appointment Contact information: [address] [place] [address] [phone]        Marinus Maw, MD Follow up on 09/19/2017.   Specialty:  Cardiology Why:  Please arrive at 10:00 AM for a 10:15 AM appointment Contact information: [address] [place] [address] [phone]          Discharge Instructions    (HEART FAILURE PATIENTS) Call MD:  Anytime you have any of the following symptoms: 1) 3 pound weight gain in 24 hours or 5 pounds in 1 week 2) shortness of breath, with or without a dry hacking cough 3) swelling in the hands, feet or stomach 4) if you have to sleep on extra pillows at night in order to breathe.   Complete by:  As directed    Diet - low sodium heart healthy   Complete by:  As directed    Heart Failure patients record your daily weight using the same scale at the same time of day   Complete by:  As directed    Increase activity slowly   Complete by:  As directed       Discharge Medications   Allergies as of 09/10/2017      Reactions   Keflex [cephalexin] Other (See Comments)   Abdominal pain   Sulfa Antibiotics Hives      Medication List    STOP taking these medications   DAYQUIL PO   valsartan-hydrochlorothiazide 320-12.5 MG tablet Commonly known as:  DIOVAN-HCT     TAKE these medications   apixaban 5 MG Tabs tablet Commonly known as:  ELIQUIS Take 1 tablet (5 mg total) by mouth 2 (two) times daily.   azithromycin 250 MG tablet Commonly known as:  ZITHROMAX Take 250-500 mg  by mouth See admin instructions. 5 day course started 09/03/17: take 2 tablets (500 mg) by mouth with supper on 1st day, then take 1  tablet (250 mg) daily with supper on days 2-5   carvedilol 6.25 MG tablet Commonly known as:  COREG Take 1 tablet (6.25 mg total) by mouth 2 (two) times daily with a meal.   digoxin 0.125 MG tablet Commonly known as:  LANOXIN Take 1 tablet (0.125 mg total) by mouth daily. Start taking on:  09/11/2017   furosemide 40 MG tablet Commonly known as:  LASIX Take 1 tablet (40 mg total) by mouth daily. Start taking on:  09/11/2017   ibuprofen 200 MG tablet Commonly known as:  ADVIL,MOTRIN Take 400 mg by mouth every 6 (six) hours as needed (pain/inflammation). Notes to Cory Jenkins:  Minimize ibuprofen use while on Eliquis.   MUCINEX PO Take 1 tablet by mouth 2 (two) times daily as needed (cough/congestion).   nortriptyline 25 MG capsule Commonly known as:  PAMELOR Take 75 mg by mouth at bedtime.   sacubitril-valsartan 24-26 MG Commonly known as:  ENTRESTO Take 1 tablet by mouth 2 (two) times daily.   spironolactone 25 MG tablet Commonly known as:  ALDACTONE Take 0.5 tablets (12.5 mg total) by mouth daily. Start taking on:  09/11/2017   zolpidem 10 MG tablet Commonly known as:  AMBIEN Take 10 mg by mouth at bedtime.        Outstanding Labs/Studies   none  Duration of Discharge Encounter   Greater than 30 minutes including physician time.  Bobbye RiggsSigned, Rayansh Herbst NP 09/10/2017, 11:57 AM

## 2017-09-10 NOTE — Progress Notes (Addendum)
Progress Note  Patient Name: Cory Jenkins Date of Encounter: 09/10/2017  Primary Cardiologist: Dr Katrinka Blazing  Subjective   No dyspnea or CP  Inpatient Medications    Scheduled Meds: . apixaban  5 mg Oral BID  . carvedilol  6.25 mg Oral BID WC  . digoxin  0.125 mg Oral Daily  . furosemide  20 mg Intravenous BID  . nortriptyline  75 mg Oral Daily  . sacubitril-valsartan  1 tablet Oral BID  . spironolactone  12.5 mg Oral Daily  . zolpidem  10 mg Oral QHS   Continuous Infusions:  PRN Meds: acetaminophen, ondansetron (ZOFRAN) IV   Vital Signs    Vitals:   09/09/17 1236 09/09/17 1616 09/09/17 2103 09/10/17 0631  BP: 115/80 115/85 (!) 131/97 126/89  Pulse: 91 88 92   Resp: 17 (!) 28 18   Temp:  97.9 F (36.6 C) (!) 97.5 F (36.4 C) 97.9 F (36.6 C)  TempSrc:  Oral Oral Oral  SpO2: 91% 93% 98%   Weight:    259 lb (117.5 kg)  Height:        Intake/Output Summary (Last 24 hours) at 09/10/2017 0724 Last data filed at 09/10/2017 4098 Gross per 24 hour  Intake 720 ml  Output 2750 ml  Net -2030 ml   Filed Weights   09/09/17 0540 09/09/17 1101 09/10/17 0631  Weight: 263 lb 3.2 oz (119.4 kg) 263 lb 3.2 oz (119.4 kg) 259 lb (117.5 kg)    Telemetry    NSR with rare PAC and PVC- Personally Reviewed  Physical Exam   GEN: WD obese NAD Neck: No JVD Cardiac: RRR Respiratory: CTA; no wheeze GI: Soft,NT/ND, no masses MS: No edema Neuro:  Grossly intact   Labs    Chemistry Recent Labs  Lab 09/06/17 1435 09/06/17 1850  09/08/17 0730 09/09/17 0648 09/10/17 0458  NA 138 139   < > 137 137 139  K 3.9 3.7   < > 3.8 3.9 4.4  CL 101 103   < > 102 105 104  CO2 23 21*   < > 25 22 24   GLUCOSE 121* 111*   < > 156* 118* 123*  BUN 15 15   < > 19 18 19   CREATININE 1.25* 1.21   < > 1.30* 0.97 1.18  CALCIUM 9.8 9.3   < > 8.9 8.8* 9.0  PROT 6.8 6.1*  --   --   --   --   ALBUMIN 4.3 3.9  --   --   --   --   AST 26 24  --   --   --   --   ALT 14* 13*  --   --   --   --     ALKPHOS 92 80  --   --   --   --   BILITOT 1.3* 1.3*  --   --   --   --   GFRNONAA >60 >60   < > >60 >60 >60  GFRAA >60 >60   < > >60 >60 >60  ANIONGAP 14 15   < > 10 10 11    < > = values in this interval not displayed.     Hematology Recent Labs  Lab 09/08/17 0730 09/09/17 0648 09/10/17 0458  WBC 10.7* 9.0 9.8  RBC 5.00 5.12 5.26  HGB 15.7 16.2 17.3*  HCT 47.2 48.4 49.5  MCV 94.4 94.5 94.1  MCH 31.4 31.6 32.9  MCHC 33.3 33.5 34.9  RDW 14.8 14.8 14.3  PLT 129* 125* 142*    Cardiac Enzymes Recent Labs  Lab 09/06/17 1850 09/07/17 0022 09/07/17 0626  TROPONINI 0.04* 0.03* <0.03    BNP Recent Labs  Lab 09/06/17 1850  BNP 842.0*     DDimer  Recent Labs  Lab 09/06/17 1435  DDIMER 1.37*     Patient Profile     52 y.o. male admitted with newly diagnosed atrial flutter and cardiomyopathy.  Echo shows severely reduced LV function; mild to moderate MR; moderate TR; moderately reduced RV function; small pericardial effusion.   Assessment & Plan    1 Atrial flutter (typical)-Pt in sinus this AM s/p DCCV; will continue dig and coreg for rate control if atrial flutter recurs. Continue apixaban 5 mg twice daily (CHADSvasc 2 for HTN and CHF).  Will arrange fu with Dr Ladona Ridgelaylor following DC for consideration of ablation.   2 cardiomyopathy-etiology unclear.  TSH is normal.  No history of alcohol abuse. Had URI in Dec; ? Viral cardiomyopathy.  In setting of atrial flutter possibly tachycardia mediated. Continue digoxin, coreg, entresto and spironolactone. Titrate meds as outpt as tolerated by pulse and BP. Will plan to repeat echocardiogram 6-8 weeks following cardioversion.  If LV function has not improved would need cardiac catheterization and probable cardiac MRI.  3 acute systolic congestive heart failure-denies dyspnea this AM; change lasix to 40 mg daily and continue spironolactone. Check K and renal function 1 week after DC. Follow low Na diet; fluid restrict to 1.5  liters daily.  4 tobacco abuse-patient counseled on discontinuing.  5 Hypertension-BP controlled; continue present meds.  6 obstructive sleep apnea-will need fu as outpt  7 elevated hgb AIC-will need fu with primary care following DC. Pt instructed.  8 mild thrombocytopenia-improved this AM.  DC today; TOC appt with APP one week; check BMET at that time; fu with Dr Ladona Ridgelaylor for consideration of ablation; FU with me 6-8 weeks (ok to add on).  >30 min PA and physician time D2  For questions or updates, please contact [facility] Please consult [email or web address] for contact info under Cardiology/STEMI.      Signed, Olga MillersBrian Crenshaw, MD  09/10/2017, 7:24 AM

## 2017-09-11 ENCOUNTER — Encounter (HOSPITAL_COMMUNITY): Payer: Self-pay | Admitting: Cardiovascular Disease

## 2017-09-16 NOTE — Progress Notes (Signed)
    [facility] prior to the [facility] leaving the hospital, this was denied.  Paperwork [facility] the [facility], to get the prior authorization.  Data [facility] the [facility].  He advised that [name] commercial insurance would not pay for [name] until the patient had been on an ARB for more than 30 days.  Mr. Cory DukeLittle went home on Entresto because we were able to get him a card for 30 days for free.  At his follow-up appointment, the [facility] will need to be discontinued and he will need to be started on ARB.  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  Prior authorization may also need to be sent in for the Eliquis, the insurance company stated that prior authorization would have to be faxed in, but a specific form for the Eliquis was not received.  Cory Demarkhonda Deshonda Cryderman, PA-C 09/16/2017 1:22 PM Beeper [phone]

## 2017-09-17 ENCOUNTER — Telehealth: Payer: Self-pay | Admitting: *Deleted

## 2017-09-17 ENCOUNTER — Ambulatory Visit (INDEPENDENT_AMBULATORY_CARE_PROVIDER_SITE_OTHER): Payer: BLUE CROSS/BLUE SHIELD | Admitting: Nurse Practitioner

## 2017-09-17 ENCOUNTER — Encounter: Payer: Self-pay | Admitting: Nurse Practitioner

## 2017-09-17 VITALS — BP 160/70 | HR 90 | Ht 72.0 in | Wt 254.8 lb

## 2017-09-17 DIAGNOSIS — I5023 Acute on chronic systolic (congestive) heart failure: Secondary | ICD-10-CM

## 2017-09-17 DIAGNOSIS — R0602 Shortness of breath: Secondary | ICD-10-CM

## 2017-09-17 MED ORDER — CARVEDILOL 12.5 MG PO TABS: 12.5000 mg | ORAL_TABLET | Freq: Two times a day (BID) | ORAL | 3 refills | Status: DC

## 2017-09-17 MED ORDER — SACUBITRIL-VALSARTAN 24-26 MG PO TABS: 1.0000 | ORAL_TABLET | Freq: Two times a day (BID) | ORAL | 0 refills | Status: DC

## 2017-09-17 NOTE — Telephone Encounter (Signed)
Sent to sleep pool. 

## 2017-09-17 NOTE — Patient Instructions (Signed)
We will be checking the following labs today - BMET, CBC and BNP   Medication Instructions:    Continue with your current medicines. BUT  I am increasing the Coreg to 12.5 mg twice a day - you can take 2 of the 6.25 mg tablets twice a day and I will send the RX for the 25 mg to your pharmacy.   I am giving you a RX for the Entresto    Testing/Procedures To Be Arranged:  N/A  Follow-Up:   See Dr. Ladona Ridgelaylor later this week as planned for discussion of ablation with Dr. Ladona Ridgelaylor  See Dr. Katrinka BlazingSmith in 2 weeks  See me in 4 weeks    Other Special Instructions:   Ok to return to work  Daily weights  Continue to restrict your salt  Will have the staff here work on the prior auth for [employer] Entresto and check on the status for Eliquis    If you need a refill on your cardiac medications before your next appointment, please call your pharmacy.   Call [facility] office at [phone] if you have any questions, problems or concerns.

## 2017-09-17 NOTE — Progress Notes (Signed)
CARDIOLOGY OFFICE NOTE   Date:  09/17/2017    Judene Companion Date of Birth: [birth year] Medical Record #[id number]  PCP:  Patient, No Pcp Per  Cardiologist:  Katrinka Blazing    Chief Complaint  Patient presents with  . Congestive Heart Failure  . Atrial Flutter    Post hospital visit - seen for Dr. Smith/Dr. Ladona Ridgel    History of Present Illness: Cory Jenkins is a 52 y.o. male who presents today for a post hospital visit. Seen for Dr. Katrinka Blazing (NEW).  He has a history of obstructive sleep apnea, essential hypertension, obesity & depression.   Wasadmitted 09/06/2017 with newly diagnosed atrial flutter and cardiomyopathy. Echo shows severely reduced LV function; mild to moderate MR; moderate TR; moderately reduced RV function; small pericardial effusion.   He noted increasing orthopnea and dyspnea on exertion and PCP treated him for an upper respiratory infection on 09/03/2017.  Dr. Sherene Sires had seen him on the day of admission and felt he was in CHF, sent to the ER.  In the ER, his heart rate was 140.  He was started on IV Cardizem with improvement in rate control.   TEE cardioversion was performed to get him back in sinus rhythm.  He was successfully cardioverted.  He was maintaining sinus rhythm at discharge.  He was placed on digoxin and carvedilol. CHADS2VASC= 2, HTN & CHF.  He is to see Dr. Ladona Ridgel as an outpatient for consideration of ablation - was not formally seen by Dr. Ladona Ridgel.  An urgent echocardiogram demonstrated an EF of 20-25%.  His systolic dysfunction could be related to tachycardia but other causes were considered.  RV function was mildly decreased as well.  He was started on IV Lasix and cardiac markers were cycled.  His troponin was only mildly abnormal, more consistent with CHF than ACS.  His BNP was over 800.  A formal echo showed an EF of 10-15%.  His TSH was normal and he has no history of EtOH abuse.  He had had an upper respiratory infection in December, so this could  be viral cardiomyopathy.  Over the course of his hospital stay, he was started on digoxin, carvedilol, Spironolactone, Lasix, and Entresto.  Plan is to have a repeat echocardiogram in 6-8 weeks.  If his EF is not improved, he will then need cardiac catheterization, probably also a cardiac MRI and possibly an ICD. Needs outpatient sleep study, smoking cessation and repeat lab. He needed authorization for his Eliquis and Entresto.  Comes in today. Here with his wife. He says he is doing "great". Feels "the best I have in weeks". Not short of breath. No chest pain. No swelling. Weight is down. Restricting his salt. Planning to return to work Monday. Seeing Dr. Ladona Ridgel Thursday to talk about ablation. Has stopped smoking. Not dizzy. Not checking his BP and we have plenty of room to titrate. Says he had a sleep study about 2 years ago - could not wear the mask - not really interested at this time to repeat/reconsider.   He is on Eliquis. He is on Entresto. He has been sent a coupon from his insurance company regarding the [place].   Past Medical History:  Diagnosis Date  . Depression   . Hypertension   . New onset atrial flutter (HCC) 09/06/2017  . OSA (obstructive sleep apnea)    "never ordered mask" (09/06/2017)    Past Surgical History:  Procedure Laterality Date  . CARDIOVERSION N/A 09/09/2017   Procedure:  CARDIOVERSION;  Surgeon: Wendall Stade, MD;  Location: [facility] ENDOSCOPY;  Service: Cardiovascular;  Laterality: N/A;  . HERNIA REPAIR    . LAPAROSCOPIC GASTRIC BANDING  ~ 2009  . TEE WITHOUT CARDIOVERSION N/A 09/09/2017   Procedure: TRANSESOPHAGEAL ECHOCARDIOGRAM (TEE);  Surgeon: Wendall Stade, MD;  Location: [facility] ENDOSCOPY;  Service: Cardiovascular;  Laterality: N/A;  . TESTICLE REMOVAL Left ~ 2007  . UMBILICAL HERNIA REPAIR  ~ 2009     Medications: Current Meds  Medication Sig  . apixaban (ELIQUIS) 5 MG TABS tablet Take 1 tablet (5 mg total) by mouth 2 (two) times daily.  . digoxin  (LANOXIN) 0.125 MG tablet Take 1 tablet (0.125 mg total) by mouth daily.  . furosemide (LASIX) 40 MG tablet Take 1 tablet (40 mg total) by mouth daily.  Marland Kitchen ibuprofen (ADVIL,MOTRIN) 200 MG tablet Take 400 mg by mouth every 6 (six) hours as needed for headache.   . nortriptyline (PAMELOR) 25 MG capsule Take 75 mg by mouth at bedtime.   . sacubitril-valsartan (ENTRESTO) 24-26 MG Take 1 tablet by mouth 2 (two) times daily.  Marland Kitchen spironolactone (ALDACTONE) 25 MG tablet Take 0.5 tablets (12.5 mg total) by mouth daily.  Marland Kitchen zolpidem (AMBIEN) 10 MG tablet Take 10 mg by mouth at bedtime.   . [DISCONTINUED] carvedilol (COREG) 6.25 MG tablet Take 1 tablet (6.25 mg total) by mouth 2 (two) times daily with a meal.  . [DISCONTINUED] sacubitril-valsartan (ENTRESTO) 24-26 MG Take 1 tablet by mouth 2 (two) times daily.     Allergies: Allergies  Allergen Reactions  . Keflex [Cephalexin] Other (See Comments)    Abdominal pain  . Sulfa Antibiotics Hives    Social History: The patient  reports that he quit smoking 11 days ago. His smoking use included cigarettes. He has a 35.00 pack-year smoking history. he has never used smokeless tobacco. He reports that he does not drink alcohol or use drugs.   Family History: The patient's family history includes Healthy in his father.   Review of Systems: Please see the history of present illness.   Otherwise, the review of systems is positive for none.   All other systems are reviewed and negative.   Physical Exam: VS:  BP (!) 160/70 (BP Location: Left Arm, Patient Position: Sitting, Cuff Size: Normal)   Pulse 90   Ht 6' (1.829 m)   Wt 254 lb 12.8 oz (115.6 kg)   BMI 34.56 kg/m  .  BMI Body mass index is 34.56 kg/m.  Wt Readings from Last 3 Encounters:  09/17/17 254 lb 12.8 oz (115.6 kg)  09/10/17 259 lb (117.5 kg)  09/06/17 276 lb (125.2 kg)    General: Pleasant. Obese. Alert and in no acute distress.   HEENT: Normal.  Neck: Supple, no JVD, carotid bruits,  or masses noted.  Cardiac: Regular rate and rhythm. Possible S3. Rate a [place] fast. No edema.  Respiratory:  Lungs are clear to auscultation bilaterally with normal work of breathing.  GI: Soft and nontender.  MS: No deformity or atrophy. Gait and ROM intact.  Skin: Warm and dry. Color is normal.  Neuro:  Strength and sensation are intact and no gross focal deficits noted.  Psych: Alert, appropriate and with normal affect.   LABORATORY DATA:  EKG:  EKG is not ordered today. This demonstrates NSR - HR is 90 - reviewed with Dr. Ladona Ridgel. Non specific T wave changes.   Lab Results  Component Value Date   WBC 9.8 09/10/2017   HGB  17.3 (H) 09/10/2017   HCT 49.5 09/10/2017   PLT 142 (L) 09/10/2017   GLUCOSE 123 (H) 09/10/2017   CHOL 142 09/07/2017   TRIG 96 09/07/2017   HDL 27 (L) 09/07/2017   LDLCALC 96 09/07/2017   ALT 13 (L) 09/06/2017   AST 24 09/06/2017   NA 139 09/10/2017   K 4.4 09/10/2017   CL 104 09/10/2017   CREATININE 1.18 09/10/2017   BUN 19 09/10/2017   CO2 24 09/10/2017   TSH 2.089 09/06/2017   HGBA1C 6.4 (H) 09/06/2017     BNP (last 3 results) Recent Labs    09/06/17 1850  BNP 842.0*    ProBNP (last 3 results) No results for input(s): PROBNP in the last 8760 hours.   Other Studies Reviewed Today:  TEE with cardioversion: 09/09/2017 Study Conclusions - Left ventricle: Previous gastric banding did not do transgastic imaging The cavity size was moderately dilated. Systolic function was severely reduced. The estimated ejection fraction was in the range of 20% to 25%. Diffuse hypokinesis. No evidence of thrombus. - Mitral valve: There was mild regurgitation. - Left atrium: The atrium was mildly dilated. No evidence of thrombus in the atrial cavity or appendage. No evidence of thrombus in the atrial cavity or appendage. No evidence of thrombus in the atrial cavity or appendage. - Atrial septum: There was increased thickness of the  septum, consistent with lipomatous hypertrophy. No defect or patent foramen ovale was identified. - Impressions: DCC x 1 150 J biphasic Converted from rapid atrial flutter 130 bpm to SR/ST rates 95-100 bpm No immediate neurologic sequelae. Impressions: - DCC x 1 150 J biphasic Converted from rapid atrial flutter 130 bpm to SR/ST rates 95-100 bpm No immediate neurologic sequelae. Successful cardioversion. No cardiac source of emboli was indentified.  ECHO: 09/06/2017 Study Conclusions - Left ventricle: The cavity size was normal. Wall thickness was normal. The estimated ejection fraction was in the range of 10% to 15%. Diffuse hypokinesis. The study is not technically sufficient to allow evaluation of LV diastolic function. - Regional wall motion abnormality: Akinesis of the basal-mid anteroseptal myocardium. - Aortic valve: Valve area (VTI): 2.73 cm^2. Valve area (Vmax): 3.02 cm^2. - Mitral valve: There was mild regurgitation. - Left atrium: The atrium was severely dilated. - Right ventricle: The cavity size was mildly dilated. Systolic function was moderately reduced. - Right atrium: The atrium was moderately dilated. - Atrial septum: No defect or patent foramen ovale was identified. - Tricuspid valve: There was mild-moderate regurgitation. - Pulmonary arteries: Systolic pressure was moderately increased. PA peak pressure: 56 mm Hg (S).    Assessment/Plan: 1. Atrial flutter - s/p cardioversion - remains in sinus by exam and by EKG - to see Dr. Ladona Ridgelaylor later this week for formal consult and consideration of atrial flutter ablation.   2. Acute on chronic systolic HF - EF of 10 to 15% by echo and 20% at time of TEE. He is on Entresto, diuretics, lanoxin, aldactone and beta blocker. We have room with both HR and BP to uptitrate his medicines - needs labs today. Will increase the Coreg to 12.5mg  a day. [facility] -  really NOT wanting to switch at this time - unclear why his insurance company would be sending him a coupon as well. He wishes to continue for now. Will need limited echo in about 6 to 8 weeks and then consider possible cath/cardiac MRI/ICD as outlined above. Would have low threshold to refer to  CHF clinic if fails to improve as well. Clinically he is doing very well. He has a good understanding of our plan. Will start titrating up his medicines. .   3. Tobacco abuse - not smoking  4. Probably OSA - says he has had prior sleep study - could not wear mask - maybe we need to see if there is some other option for him. Consider referral to Dr. Cathie Beams. Discuss further on return.   5. Prior bronchitis - resolved. This may have been the initial culprit for his demise.   6. Chronic anticoagulation - no problems noted.   7. High risk medicine - lab today.   Current medicines are reviewed with the patient today.  The patient does not have concerns regarding medicines other than what has been noted above.  The following changes have been made:  See above.  Labs/ tests ordered today include:    Orders Placed This Encounter  Procedures  . Basic metabolic panel  . CBC  . Pro b natriuretic peptide (BNP)  . EKG 12-Lead     Disposition:   FU with Dr. Katrinka Blazing in 2 weeks, me 2 weeks later with goal to continue uptitration of his medicines.   Patient is agreeable to this plan and will call if any problems develop in the interim.   SignedNorma Fredrickson, NP  09/17/2017 12:38 PM  [facility] [address] [place], [place]  [address] Phone: [phone] Fax: [phone]

## 2017-09-17 NOTE — Telephone Encounter (Signed)
-----   Message from Rosalio MacadamiaLori C Gerhardt, NP sent at 09/17/2017 12:28 PM EST ----- Needs outpatient sleep study please.  lori

## 2017-09-18 ENCOUNTER — Telehealth: Payer: Self-pay

## 2017-09-18 LAB — BASIC METABOLIC PANEL
BUN/Creatinine Ratio: 15 (ref 9–20)
BUN: 18 mg/dL (ref 6–24)
CO2: 24 mmol/L (ref 20–29)
Calcium: 10.2 mg/dL (ref 8.7–10.2)
Chloride: 99 mmol/L (ref 96–106)
Creatinine, Ser: 1.19 mg/dL (ref 0.76–1.27)
GFR calc Af Amer: 81 mL/min/{1.73_m2} (ref >59)
GFR calc non Af Amer: 70 mL/min/{1.73_m2} (ref >59)
Glucose: 91 mg/dL (ref 65–99)
Potassium: 4.5 mmol/L (ref 3.5–5.2)
Sodium: 140 mmol/L (ref 134–144)

## 2017-09-18 LAB — CBC
Hematocrit: 58.3 % — ABNORMAL HIGH (ref 37.5–51.0)
Hemoglobin: 20.6 g/dL (ref 13.0–17.7)
MCH: 32.4 pg (ref 26.6–33.0)
MCHC: 35.3 g/dL (ref 31.5–35.7)
MCV: 92 fL (ref 79–97)
Platelets: 174 10*3/uL (ref 150–379)
RBC: 6.36 x10E6/uL — ABNORMAL HIGH (ref 4.14–5.80)
RDW: 14.6 % (ref 12.3–15.4)
WBC: 9.8 10*3/uL (ref 3.4–10.8)

## 2017-09-18 LAB — PRO B NATRIURETIC PEPTIDE: NT-Pro BNP: 471 pg/mL — ABNORMAL HIGH (ref 0–121)

## 2017-09-18 NOTE — Telephone Encounter (Signed)
I have done an ValentineEntresto PA through covermymeds. I did receive an immediate approval message from covermymeds stating that approval is good from 09/18/2017 until 08/12/2038.

## 2017-09-18 NOTE — Telephone Encounter (Signed)
Spoke with patient in regards to his carvedilol prescription.  He was started on an increased dose 12.5 mg bid on 2/5.   He felt lightheaded and dizzy today and checked his blood pressure 90/70.  He wanted to know if he should reduce his dosage back to 6.25 mg.   I told him to hold his evening dose, hydrate and will f/u with him at his AM appt with Dr. Ladona Ridgelaylor.

## 2017-09-19 ENCOUNTER — Telehealth: Payer: Self-pay | Admitting: Interventional Cardiology

## 2017-09-19 ENCOUNTER — Encounter: Payer: Self-pay | Admitting: Internal Medicine

## 2017-09-19 ENCOUNTER — Ambulatory Visit (INDEPENDENT_AMBULATORY_CARE_PROVIDER_SITE_OTHER): Payer: BLUE CROSS/BLUE SHIELD | Admitting: Internal Medicine

## 2017-09-19 VITALS — BP 106/72 | HR 99 | Ht 72.0 in | Wt 254.0 lb

## 2017-09-19 DIAGNOSIS — I5022 Chronic systolic (congestive) heart failure: Secondary | ICD-10-CM | POA: Diagnosis not present

## 2017-09-19 DIAGNOSIS — I4892 Unspecified atrial flutter: Secondary | ICD-10-CM

## 2017-09-19 NOTE — Telephone Encounter (Signed)
Pt recently seen by Norma FredricksonLori Gerhardt, NP.  Will route to her and Dr. Katrinka BlazingSmith for review.  Will also route to medical records in regards to seeing about ROI form being filled out so we can send info to short term disability.

## 2017-09-19 NOTE — Patient Instructions (Addendum)
Medication Instructions:  Your physician recommends that you continue on your current medications as directed. Please refer to the Current Medication list given to you today.  Labwork: You will get lab work on October 02, 2017:  BMP and CBC.  Come in at any time.  You do not need to be fasting.  Testing/Procedures: Your physician has recommended that you have an ablation. Catheter ablation is a medical procedure used to treat some cardiac arrhythmias (irregular heartbeats). During catheter ablation, a long, thin, flexible tube is put into a blood vessel in your groin (upper thigh), or neck. This tube is called an ablation catheter. It is then guided to your heart through the blood vessel. Radio frequency waves destroy small areas of heart tissue where abnormal heartbeats may cause an arrhythmia to start. Please see the instruction sheet given to you today.  Follow-Up: You will follow up with Dr. Ladona Ridgel in 4 weeks.  Any Other Special Instructions Will Be Listed Below (If Applicable).  Please arrive at the [facility] main entrance of [place] [facility] at:  5:30 am on October 07, 2017. Do not eat or drink after midnight prior to procedure. You may take all your morning medications with a sip of water except for:  ELIQUIS, LASIX and SPIRONOLACTONE. Plan for one night stay.  You will need someone to drive you home at discharge.  If you need a refill on your cardiac medications before your next appointment, please call your pharmacy.   Cardiac Ablation Cardiac ablation is a procedure to disable (ablate) a small amount of heart tissue in very specific places. The heart has many electrical connections. Sometimes these connections are abnormal and can cause the heart to beat very fast or irregularly. Ablating some of the problem areas can improve the heart rhythm or return it to normal. Ablation may be done for people who:  Have Wolff-Parkinson-White syndrome.  Have fast heart rhythms  (tachycardia).  Have taken medicines for an abnormal heart rhythm (arrhythmia) that were not effective or caused side effects.  Have a high-risk heartbeat that may be life-threatening.  During the procedure, a small incision is made in the neck or the groin, and a long, thin, flexible tube (catheter) is inserted into the incision and moved to the heart. Small devices (electrodes) on the tip of the catheter will send out electrical currents. A type of X-ray (fluoroscopy) will be used to help guide the catheter and to provide images of the heart. Tell a health care provider about:  Any allergies you have.  All medicines you are taking, including vitamins, herbs, eye drops, creams, and over-the-counter medicines.  Any problems you or family members have had with anesthetic medicines.  Any blood disorders you have.  Any surgeries you have had.  Any medical conditions you have, such as kidney failure.  Whether you are pregnant or may be pregnant. What are the risks? Generally, this is a safe procedure. However, problems may occur, including:  Infection.  Bruising and bleeding at the catheter insertion site.  Bleeding into the chest, especially into the sac that surrounds the heart. This is a serious complication.  Stroke or blood clots.  Damage to other structures or organs.  Allergic reaction to medicines or dyes.  Need for a permanent pacemaker if the normal electrical system is damaged. A pacemaker is a small computer that sends electrical signals to the heart and helps your heart beat normally.  The procedure not being fully effective. This may not be recognized  until months later. Repeat ablation procedures are sometimes required.  What happens before the procedure?  Follow instructions from your health care provider about eating or drinking restrictions.  Ask your health care provider about: ? Changing or stopping your regular medicines. This is especially important if  you are taking diabetes medicines or blood thinners. ? Taking medicines such as aspirin and ibuprofen. These medicines can thin your blood. Do not take these medicines before your procedure if your health care provider instructs you not to.  Plan to have someone take you home from the hospital or clinic.  If you will be going home right after the procedure, plan to have someone with you for 24 hours. What happens during the procedure?  To lower your risk of infection: ? Your health care team will wash or sanitize their hands. ? Your skin will be washed with soap. ? Hair may be removed from the incision area.  An IV tube will be inserted into one of your veins.  You will be given a medicine to help you relax (sedative).  The skin on your neck or groin will be numbed.  An incision will be made in your neck or your groin.  A needle will be inserted through the incision and into a large vein in your neck or groin.  A catheter will be inserted into the needle and moved to your heart.  Dye may be injected through the catheter to help your surgeon see the area of the heart that needs treatment.  Electrical currents will be sent from the catheter to ablate heart tissue in desired areas. There are three types of energy that may be used to ablate heart tissue: ? Heat (radiofrequency energy). ? Laser energy. ? Extreme cold (cryoablation).  When the necessary tissue has been ablated, the catheter will be removed.  Pressure will be held on the catheter insertion area to prevent excessive bleeding.  A bandage (dressing) will be placed over the catheter insertion area. The procedure may vary among health care providers and hospitals. What happens after the procedure?  Your blood pressure, heart rate, breathing rate, and blood oxygen level will be monitored until the medicines you were given have worn off.  Your catheter insertion area will be monitored for bleeding. You will need to lie  still for a few hours to ensure that you do not bleed from the catheter insertion area.  Do not drive for 24 hours or as long as directed by your health care provider. Summary  Cardiac ablation is a procedure to disable (ablate) a small amount of heart tissue in very specific places. Ablating some of the problem areas can improve the heart rhythm or return it to normal.  During the procedure, electrical currents will be sent from the catheter to ablate heart tissue in desired areas. This information is not intended to replace advice given to you by your health care provider. Make sure you discuss any questions you have with your health care provider. Document Released: 12/16/2008 Document Revised: 06/18/2016 Document Reviewed: 06/18/2016 Elsevier Interactive Patient Education  [employer]2018 Elsevier Inc.

## 2017-09-19 NOTE — H&P (View-Only) (Signed)
HPI Mr. Cory Jenkins is referred today by Dr. Katrinka Blazing for evaluation of atrial flutter. He presented with a tachycardia induced (most likely) CM over a week ago and underwent TEE guided DCCV. He has done well in the interim and his dyspnea is much improved. He has not had angina. He feels much better that he is back in rhythm. He did experience palpitations with his atrial flutter. He has severe LV dysfunction by echo. He has never had syncope.  Allergies  Allergen Reactions  . Keflex [Cephalexin] Other (See Comments)    Abdominal pain  . Sulfa Antibiotics Hives     Current Outpatient Medications  Medication Sig Dispense Refill  . apixaban (ELIQUIS) 5 MG TABS tablet Take 1 tablet (5 mg total) by mouth 2 (two) times daily. 60 tablet 0  . digoxin (LANOXIN) 0.125 MG tablet Take 1 tablet (0.125 mg total) by mouth daily. 30 tablet 11  . furosemide (LASIX) 40 MG tablet Take 1 tablet (40 mg total) by mouth daily. 30 tablet 11  . ibuprofen (ADVIL,MOTRIN) 200 MG tablet Take 400 mg by mouth every 6 (six) hours as needed for headache.     . nortriptyline (PAMELOR) 25 MG capsule Take 75 mg by mouth at bedtime.     . sacubitril-valsartan (ENTRESTO) 24-26 MG Take 1 tablet by mouth 2 (two) times daily. 60 tablet 0  . spironolactone (ALDACTONE) 25 MG tablet Take 0.5 tablets (12.5 mg total) by mouth daily. 30 tablet 11  . zolpidem (AMBIEN) 10 MG tablet Take 10 mg by mouth at bedtime.     . carvedilol (COREG) 12.5 MG tablet Take 1 tablet (12.5 mg total) by mouth 2 (two) times daily. (Patient not taking: Reported on 09/19/2017) 60 tablet 3   No current facility-administered medications for this visit.      Past Medical History:  Diagnosis Date  . Depression   . Hypertension   . New onset atrial flutter (HCC) 09/06/2017  . OSA (obstructive sleep apnea)    "never ordered mask" (09/06/2017)    ROS:   All systems reviewed and negative except as noted in the HPI.   Past Surgical History:  Procedure  Laterality Date  . CARDIOVERSION N/A 09/09/2017   Procedure: CARDIOVERSION;  Surgeon: Wendall Stade, MD;  Location: [facility] ENDOSCOPY;  Service: Cardiovascular;  Laterality: N/A;  . HERNIA REPAIR    . LAPAROSCOPIC GASTRIC BANDING  ~ 2009  . TEE WITHOUT CARDIOVERSION N/A 09/09/2017   Procedure: TRANSESOPHAGEAL ECHOCARDIOGRAM (TEE);  Surgeon: Wendall Stade, MD;  Location: [facility] ENDOSCOPY;  Service: Cardiovascular;  Laterality: N/A;  . TESTICLE REMOVAL Left ~ 2007  . UMBILICAL HERNIA REPAIR  ~ 2009     Family History  Problem Relation Age of Onset  . Healthy Father      Social History   Socioeconomic History  . Marital status: Married    Spouse name: Not on file  . Number of children: Not on file  . Years of education: Not on file  . Highest education level: Not on file  Social Needs  . Financial resource strain: Not on file  . Food insecurity - worry: Not on file  . Food insecurity - inability: Not on file  . Transportation needs - medical: Not on file  . Transportation needs - non-medical: Not on file  Occupational History  . Not on file  Tobacco Use  . Smoking status: Former Smoker    Packs/day: 1.00    Years: 35.00  Pack years: 35.00    Types: Cigarettes    Last attempt to quit: 09/06/2017    Years since quitting: 0.0  . Smokeless tobacco: Never Used  Substance and Sexual Activity  . Alcohol use: No    Alcohol/week: 0.0 oz  . Drug use: No  . Sexual activity: Yes  Other Topics Concern  . Not on file  Social History Narrative  . Not on file     BP 106/72   Pulse 99   Ht 6' (1.829 m)   Wt 254 lb (115.2 kg)   BMI 34.45 kg/m   Physical Exam:  Well appearing but overweight 52 yo man, NAD HEENT: Unremarkable Neck:  6 cm JVD, no thyromegally Lymphatics:  No adenopathy Back:  No CVA tenderness Lungs:  Clear with no wheezes HEART:  Regular rate rhythm, no murmurs, no rubs, no clicks Abd:  soft, positive bowel sounds, no organomegally, no rebound, no  guarding Ext:  2 plus pulses, no edema, no cyanosis, no clubbing Skin:  No rashes no nodules Neuro:  CN II through XII intact, motor grossly intact  EKG - NSR with non-specific STT changes   Assess/Plan: 1. Atrial flutter - he is now back in rhythm and is tolerating his systemic anti-coagulation. I have recommended her undergo EP study and catheter ablation of his atrial flutter 3 weeks after his cardioversion. This will be scheduled ASAP.  2. Coags - he will hold his Eliquis the morning of the ablation and then take an additional 2-3 weeks after the ablation.  3. Cardiomyopathy/chronic systolic heart failure - he has improved and I suspect his EF will normalize with NSR.  4. Obesity - we discussed the importance of weight loss.  5. Sleep apnea - we discussed how important this is for treatment. If his obesity and sleep apnea cannot be controlled, he will be at increased risk for devloping atrial fib.  Leonia ReevesGregg Truett Mcfarlan,M.D

## 2017-09-19 NOTE — Telephone Encounter (Signed)
New Message   Sallye OberLouise is calling from short term disability. They need to know when the patient can return to work and what are the restrictions. Please call to discuss.

## 2017-09-19 NOTE — Progress Notes (Signed)
HPI Cory Jenkins is referred today by Dr. Katrinka Blazing for evaluation of atrial flutter. He presented with a tachycardia induced (most likely) CM over a week ago and underwent TEE guided DCCV. He has done well in the interim and his dyspnea is much improved. He has not had angina. He feels much better that he is back in rhythm. He did experience palpitations with his atrial flutter. He has severe LV dysfunction by echo. He has never had syncope.  Allergies  Allergen Reactions  . Keflex [Cephalexin] Other (See Comments)    Abdominal pain  . Sulfa Antibiotics Hives     Current Outpatient Medications  Medication Sig Dispense Refill  . apixaban (ELIQUIS) 5 MG TABS tablet Take 1 tablet (5 mg total) by mouth 2 (two) times daily. 60 tablet 0  . digoxin (LANOXIN) 0.125 MG tablet Take 1 tablet (0.125 mg total) by mouth daily. 30 tablet 11  . furosemide (LASIX) 40 MG tablet Take 1 tablet (40 mg total) by mouth daily. 30 tablet 11  . ibuprofen (ADVIL,MOTRIN) 200 MG tablet Take 400 mg by mouth every 6 (six) hours as needed for headache.     . nortriptyline (PAMELOR) 25 MG capsule Take 75 mg by mouth at bedtime.     . sacubitril-valsartan (ENTRESTO) 24-26 MG Take 1 tablet by mouth 2 (two) times daily. 60 tablet 0  . spironolactone (ALDACTONE) 25 MG tablet Take 0.5 tablets (12.5 mg total) by mouth daily. 30 tablet 11  . zolpidem (AMBIEN) 10 MG tablet Take 10 mg by mouth at bedtime.     . carvedilol (COREG) 12.5 MG tablet Take 1 tablet (12.5 mg total) by mouth 2 (two) times daily. (Patient not taking: Reported on 09/19/2017) 60 tablet 3   No current facility-administered medications for this visit.      Past Medical History:  Diagnosis Date  . Depression   . Hypertension   . New onset atrial flutter (HCC) 09/06/2017  . OSA (obstructive sleep apnea)    "never ordered mask" (09/06/2017)    ROS:   All systems reviewed and negative except as noted in the HPI.   Past Surgical History:  Procedure  Laterality Date  . CARDIOVERSION N/A 09/09/2017   Procedure: CARDIOVERSION;  Surgeon: Wendall Stade, MD;  Location: [facility] ENDOSCOPY;  Service: Cardiovascular;  Laterality: N/A;  . HERNIA REPAIR    . LAPAROSCOPIC GASTRIC BANDING  ~ 2009  . TEE WITHOUT CARDIOVERSION N/A 09/09/2017   Procedure: TRANSESOPHAGEAL ECHOCARDIOGRAM (TEE);  Surgeon: Wendall Stade, MD;  Location: [facility] ENDOSCOPY;  Service: Cardiovascular;  Laterality: N/A;  . TESTICLE REMOVAL Left ~ 2007  . UMBILICAL HERNIA REPAIR  ~ 2009     Family History  Problem Relation Age of Onset  . Healthy Father      Social History   Socioeconomic History  . Marital status: Married    Spouse name: Not on file  . Number of children: Not on file  . Years of education: Not on file  . Highest education level: Not on file  Social Needs  . Financial resource strain: Not on file  . Food insecurity - worry: Not on file  . Food insecurity - inability: Not on file  . Transportation needs - medical: Not on file  . Transportation needs - non-medical: Not on file  Occupational History  . Not on file  Tobacco Use  . Smoking status: Former Smoker    Packs/day: 1.00    Years: 35.00  Pack years: 35.00    Types: Cigarettes    Last attempt to quit: 09/06/2017    Years since quitting: 0.0  . Smokeless tobacco: Never Used  Substance and Sexual Activity  . Alcohol use: No    Alcohol/week: 0.0 oz  . Drug use: No  . Sexual activity: Yes  Other Topics Concern  . Not on file  Social History Narrative  . Not on file     BP 106/72   Pulse 99   Ht 6' (1.829 m)   Wt 254 lb (115.2 kg)   BMI 34.45 kg/m   Physical Exam:  Well appearing but overweight 52 yo man, NAD HEENT: Unremarkable Neck:  6 cm JVD, no thyromegally Lymphatics:  No adenopathy Back:  No CVA tenderness Lungs:  Clear with no wheezes HEART:  Regular rate rhythm, no murmurs, no rubs, no clicks Abd:  soft, positive bowel sounds, no organomegally, no rebound, no  guarding Ext:  2 plus pulses, no edema, no cyanosis, no clubbing Skin:  No rashes no nodules Neuro:  CN II through XII intact, motor grossly intact  EKG - NSR with non-specific STT changes   Assess/Plan: 1. Atrial flutter - he is now back in rhythm and is tolerating his systemic anti-coagulation. I have recommended her undergo EP study and catheter ablation of his atrial flutter 3 weeks after his cardioversion. This will be scheduled ASAP.  2. Coags - he will hold his Eliquis the morning of the ablation and then take an additional 2-3 weeks after the ablation.  3. Cardiomyopathy/chronic systolic heart failure - he has improved and I suspect his EF will normalize with NSR.  4. Obesity - we discussed the importance of weight loss.  5. Sleep apnea - we discussed how important this is for treatment. If his obesity and sleep apnea cannot be controlled, he will be at increased risk for devloping atrial fib.  Cory Jenkins,M.D

## 2017-09-20 ENCOUNTER — Telehealth: Payer: Self-pay

## 2017-09-20 NOTE — Telephone Encounter (Signed)
Staff [facility] [employer] ( Copy service) asking if she has any paperwork on this patient.

## 2017-09-20 NOTE — Telephone Encounter (Signed)
Spoke with patient who still has concerns about carvedilol and his BP.    He saw Dr. Ladona Ridgelaylor on 2/7 in the office and his BP was 106/72, without any carvedilol.  He was told to resume carvedilol this AM (6.25mg ).    When he gets up and moves around he feels dizzy.  He checked his BP and the lowest was 80/65 and highest 97/69.  I told him to once again not to take this evening's dose and stay well hydrated.  Please advise, thank you.

## 2017-09-20 NOTE — Telephone Encounter (Signed)
Ok to send my office note - he was planning on returning to work Monday, February 11th. No restrictions at this time. Does not engage in physical labor.

## 2017-09-23 ENCOUNTER — Other Ambulatory Visit: Payer: Self-pay | Admitting: *Deleted

## 2017-09-23 MED ORDER — CARVEDILOL 6.25 MG PO TABS: 6.2500 mg | ORAL_TABLET | Freq: Two times a day (BID) | ORAL | 3 refills | Status: DC

## 2017-09-23 NOTE — Telephone Encounter (Signed)
Stay on the 6.25 mg BID until seen back for follow up visit.   Please make sure his medication record is updated.   Lawson FiscalLori

## 2017-09-23 NOTE — Telephone Encounter (Signed)
S/w pt was very confusing.  Stated after Dr. Ladona Ridgelaylor visit on Feb 7, Dr. Ladona Ridgelaylor stated to hold coreg Thursday evening and resume on Friday. S/w Sigurd SosMichael Dapp, RN, on Friday, February 8  was told to hold coreg Friday night, and resume Saturday, Pt stated resumed Coreg (6.25 mg) bid on Saturday and has been fine ever since. Stated bp has been normal, could not give readings pt is at work.  Will stay on current dose at (6.25 mg ) unless Lori changes.  Will send to Lawson FiscalLori to advise.

## 2017-09-23 NOTE — Telephone Encounter (Signed)
Medication list updated.

## 2017-09-26 ENCOUNTER — Encounter (HOSPITAL_COMMUNITY): Payer: Self-pay

## 2017-09-30 ENCOUNTER — Encounter (INDEPENDENT_AMBULATORY_CARE_PROVIDER_SITE_OTHER): Payer: Self-pay

## 2017-09-30 NOTE — Telephone Encounter (Signed)
Followed up with pt regarding his MyChart message. Pt was concerned with low BP's this morning, around 90/76, with some lightheadedness. At the time of my call, 1225 pm, pt denied having any low BP symptoms but was unable to take a new reading. I told him I would contact his NP for further recommendation.

## 2017-10-01 ENCOUNTER — Other Ambulatory Visit: Payer: Self-pay | Admitting: *Deleted

## 2017-10-01 MED ORDER — CARVEDILOL 3.125 MG PO TABS: 3.1250 mg | ORAL_TABLET | Freq: Two times a day (BID) | ORAL | 3 refills | Status: DC

## 2017-10-01 NOTE — Telephone Encounter (Signed)
S/w pt verified dose of coreg at (6.25 mg) bid, decreased to (3.125 mg ) bid, medication list updated.

## 2017-10-02 ENCOUNTER — Other Ambulatory Visit: Payer: BLUE CROSS/BLUE SHIELD | Admitting: *Deleted

## 2017-10-02 DIAGNOSIS — I5022 Chronic systolic (congestive) heart failure: Secondary | ICD-10-CM

## 2017-10-02 DIAGNOSIS — I4892 Unspecified atrial flutter: Secondary | ICD-10-CM

## 2017-10-02 LAB — CBC WITH DIFFERENTIAL/PLATELET
BASOS ABS: 0 10*3/uL (ref 0.0–0.2)
Basos: 0 %
EOS (ABSOLUTE): 0.2 10*3/uL (ref 0.0–0.4)
EOS: 3 %
HEMOGLOBIN: 18.6 g/dL — AB (ref 13.0–17.7)
Hematocrit: 52.4 % — ABNORMAL HIGH (ref 37.5–51.0)
IMMATURE GRANS (ABS): 0 10*3/uL (ref 0.0–0.1)
Immature Granulocytes: 0 %
LYMPHS: 15 %
Lymphocytes Absolute: 1.4 10*3/uL (ref 0.7–3.1)
MCH: 32 pg (ref 26.6–33.0)
MCHC: 35.5 g/dL (ref 31.5–35.7)
MCV: 90 fL (ref 79–97)
MONOCYTES: 6 %
Monocytes Absolute: 0.5 10*3/uL (ref 0.1–0.9)
NEUTROS ABS: 6.8 10*3/uL (ref 1.4–7.0)
Neutrophils: 76 %
Platelets: 174 10*3/uL (ref 150–379)
RBC: 5.81 x10E6/uL — AB (ref 4.14–5.80)
RDW: 13.7 % (ref 12.3–15.4)
WBC: 8.9 10*3/uL (ref 3.4–10.8)

## 2017-10-02 LAB — BASIC METABOLIC PANEL
BUN/Creatinine Ratio: 17 (ref 9–20)
BUN: 19 mg/dL (ref 6–24)
CO2: 24 mmol/L (ref 20–29)
CREATININE: 1.11 mg/dL (ref 0.76–1.27)
Calcium: 9.6 mg/dL (ref 8.7–10.2)
Chloride: 98 mmol/L (ref 96–106)
GFR calc Af Amer: 88 mL/min/{1.73_m2} (ref >59)
GFR calc non Af Amer: 76 mL/min/{1.73_m2} (ref >59)
GLUCOSE: 222 mg/dL — AB (ref 65–99)
Potassium: 5.1 mmol/L (ref 3.5–5.2)
Sodium: 137 mmol/L (ref 134–144)

## 2017-10-03 ENCOUNTER — Encounter: Payer: Self-pay | Admitting: Interventional Cardiology

## 2017-10-03 ENCOUNTER — Ambulatory Visit (INDEPENDENT_AMBULATORY_CARE_PROVIDER_SITE_OTHER): Payer: BLUE CROSS/BLUE SHIELD | Admitting: Interventional Cardiology

## 2017-10-03 VITALS — BP 90/78 | HR 100 | Ht 72.0 in | Wt 256.0 lb

## 2017-10-03 DIAGNOSIS — F17200 Nicotine dependence, unspecified, uncomplicated: Secondary | ICD-10-CM | POA: Diagnosis not present

## 2017-10-03 DIAGNOSIS — I10 Essential (primary) hypertension: Secondary | ICD-10-CM | POA: Diagnosis not present

## 2017-10-03 DIAGNOSIS — I4892 Unspecified atrial flutter: Secondary | ICD-10-CM | POA: Diagnosis not present

## 2017-10-03 DIAGNOSIS — G4733 Obstructive sleep apnea (adult) (pediatric): Secondary | ICD-10-CM

## 2017-10-03 DIAGNOSIS — I5041 Acute combined systolic (congestive) and diastolic (congestive) heart failure: Secondary | ICD-10-CM | POA: Diagnosis not present

## 2017-10-03 HISTORY — DX: Essential (primary) hypertension: I10

## 2017-10-03 MED ORDER — CARVEDILOL 6.25 MG PO TABS: 6.2500 mg | ORAL_TABLET | Freq: Two times a day (BID) | ORAL | 3 refills | Status: DC

## 2017-10-03 NOTE — Progress Notes (Signed)
Cardiology Office Note    Date:  10/03/2017   ID:  [name], DOB 1966/01/05, MRN [id number]  PCP:  Patient, No Pcp Per  Cardiologist: Lesleigh Noe, MD   Chief Complaint  Patient presents with  . Atrial Fibrillation  . Congestive Heart Failure    History of Present Illness:  Kaelem Brach. is a 52 y.o. male admitted with newly diagnosed atrial flutter and cardiomyopathy September 06, 2017. Echo shows severely reduced LV function; mild to moderate MR; moderate TR; moderately reduced RV function; small pericardial effusion.  Underwent electrical cardioversion 09/09/2017 and subsequent optimization of therapy to improve LV systolic dysfunction.  Not had ischemic workup.  Has not had MRI to look for evidence of infiltrative/inflammatory disease.  Has seen electrophysiology/Dr. Ladona Ridgel and is scheduled for flutter ablation within the next 7 days.  Tad is doing okay.  This whole experience has been very frightening.  He inquired about whether there was any friction among our group concerning who he has chosen to see is his cardiologist.  He was initially told Dr. Jens Som but preferred to stay in my care.  He has seen Dr. Ladona Ridgel and has a flutter ablation set to be done within the next week.  He was frightened by the prospects of the electrical cardioversion and wants to do everything we can to prevent recurrence of atrial flutter.  He did not have symptoms that warrant him he was in atrial flutter.  He presented only after he developed heart failure symptoms.  On the current medical regimen, he states being back to his normal condition.  No shortness of breath or chest discomfort has been experienced.  No medication side effects although earlier this week he had some dizziness and was told to cut back on carvedilol to 3.125 mg twice daily.     Past Medical History:  Diagnosis Date  . Depression   . Hypertension   . New onset atrial flutter (HCC) 09/06/2017  . OSA  (obstructive sleep apnea)    "never ordered mask" (09/06/2017)    Past Surgical History:  Procedure Laterality Date  . CARDIOVERSION N/A 09/09/2017   Procedure: CARDIOVERSION;  Surgeon: Wendall Stade, MD;  Location: [facility] ENDOSCOPY;  Service: Cardiovascular;  Laterality: N/A;  . HERNIA REPAIR    . LAPAROSCOPIC GASTRIC BANDING  ~ 2009  . TEE WITHOUT CARDIOVERSION N/A 09/09/2017   Procedure: TRANSESOPHAGEAL ECHOCARDIOGRAM (TEE);  Surgeon: Wendall Stade, MD;  Location: [facility] ENDOSCOPY;  Service: Cardiovascular;  Laterality: N/A;  . TESTICLE REMOVAL Left ~ 2007  . UMBILICAL HERNIA REPAIR  ~ 2009    Current Medications: Outpatient Medications Prior to Visit  Medication Sig Dispense Refill  . apixaban (ELIQUIS) 5 MG TABS tablet Take 1 tablet (5 mg total) by mouth 2 (two) times daily. 60 tablet 0  . digoxin (LANOXIN) 0.125 MG tablet Take 1 tablet (0.125 mg total) by mouth daily. 30 tablet 11  . furosemide (LASIX) 40 MG tablet Take 1 tablet (40 mg total) by mouth daily. 30 tablet 11  . nortriptyline (PAMELOR) 25 MG capsule Take 75 mg by mouth at bedtime.     . sacubitril-valsartan (ENTRESTO) 24-26 MG Take 1 tablet by mouth 2 (two) times daily. 60 tablet 0  . spironolactone (ALDACTONE) 25 MG tablet Take 0.5 tablets (12.5 mg total) by mouth daily. 30 tablet 11  . zolpidem (AMBIEN) 10 MG tablet Take 10 mg by mouth at bedtime.     . carvedilol (COREG) 3.125  MG tablet Take 3.125 mg by mouth 2 (two) times daily with a meal.    . carvedilol (COREG) 3.125 MG tablet Take 1 tablet (3.125 mg total) by mouth 2 (two) times daily. (Patient not taking: Reported on 10/03/2017) 60 tablet 3  . carvedilol (COREG) 6.25 MG tablet Take 6.25 mg by mouth 2 (two) times daily with a meal.     No facility-administered medications prior to visit.      Allergies:   Keflex [cephalexin] and Sulfa antibiotics   Social History   Socioeconomic History  . Marital status: Married    Spouse name: None  . Number of children:  None  . Years of education: None  . Highest education level: None  Social Needs  . Financial resource strain: None  . Food insecurity - worry: None  . Food insecurity - inability: None  . Transportation needs - medical: None  . Transportation needs - non-medical: None  Occupational History  . None  Tobacco Use  . Smoking status: Former Smoker    Packs/day: 1.00    Years: 35.00    Pack years: 35.00    Types: Cigarettes    Last attempt to quit: 09/06/2017    Years since quitting: 0.0  . Smokeless tobacco: Never Used  Substance and Sexual Activity  . Alcohol use: No    Alcohol/week: 0.0 oz  . Drug use: No  . Sexual activity: Yes  Other Topics Concern  . None  Social History Narrative  . None     Family History:  The patient's family history includes Healthy in his father.   ROS:   Please see the history of present illness.    Anxiety.  Now dieting and is gradually losing weight.  He is sleeping well without heavy snoring according to his wife. All other systems reviewed and are negative.   PHYSICAL EXAM:   VS:  BP 90/78   Pulse 100   Ht 6' (1.829 m)   Wt 256 lb (116.1 kg)   BMI 34.72 kg/m    GEN: Well nourished, well developed, in no acute distress  HEENT: normal  Neck: no JVD, carotid bruits, or masses Cardiac: RRR; no murmurs, rubs, or gallops,no edema  Respiratory:  clear to auscultation bilaterally, normal work of breathing GI: soft, nontender, nondistended, + BS MS: no deformity or atrophy  Skin: warm and dry, no rash Neuro:  Alert and Oriented x 3, Strength and sensation are intact Psych: euthymic mood, full affect  Wt Readings from Last 3 Encounters:  10/03/17 256 lb (116.1 kg)  09/19/17 254 lb (115.2 kg)  09/17/17 254 lb 12.8 oz (115.6 kg)      Studies/Labs Reviewed:   EKG:  EKG not repeated  Recent Labs: 09/06/2017: ALT 13; B Natriuretic Peptide 842.0; Magnesium 1.6; TSH 2.089 09/17/2017: NT-Pro BNP 471 10/02/2017: BUN 19; Creatinine, Ser 1.11;  Hemoglobin 18.6; Platelets 174; Potassium 5.1; Sodium 137   Lipid Panel    Component Value Date/Time   CHOL 142 09/07/2017 0626   TRIG 96 09/07/2017 0626   HDL 27 (L) 09/07/2017 0626   CHOLHDL 5.3 09/07/2017 0626   VLDL 19 09/07/2017 0626   LDLCALC 96 09/07/2017 0626    Additional studies/ records that were reviewed today include:  Overall no new data.  Echocardiogram 09/06/2017: Study Conclusions   - Left ventricle: The cavity size was normal. Wall thickness was   normal. The estimated ejection fraction was in the range of 10%   to 15%. Diffuse hypokinesis.  The study is not technically   sufficient to allow evaluation of LV diastolic function. - Regional wall motion abnormality: Akinesis of the basal-mid   anteroseptal myocardium. - Aortic valve: Valve area (VTI): 2.73 cm^2. Valve area (Vmax):   3.02 cm^2. - Mitral valve: There was mild regurgitation. - Left atrium: The atrium was severely dilated. - Right ventricle: The cavity size was mildly dilated. Systolic   function was moderately reduced. - Right atrium: The atrium was moderately dilated. - Atrial septum: No defect or patent foramen ovale was identified. - Tricuspid valve: There was mild-moderate regurgitation. - Pulmonary arteries: Systolic pressure was moderately increased.   PA peak pressure: 56 mm Hg (S).        ASSESSMENT:    1. Atrial flutter, unspecified type (HCC)   2. OSA (obstructive sleep apnea)   3. Acute combined systolic and diastolic heart failure (HCC)   4. Tobacco use disorder   5. Essential hypertension      PLAN:  In order of problems listed above:  1. He is scheduled to undergo atrial flutter ablation by Dr. Ladona Ridgel within the next week.  I agree with decision as it would give [place] protection against recurrence of asymptomatic atrial flutter which can aggravate recovery of LV function. 2. Snoring less and sleeping better with weight loss.  We did discuss resolving this issue within the  next 6 months depending upon LV function recovery and the upcoming ablation. 3. Acute combined systolic and diastolic heart failure has been resolved to compensated/chronic systolic heart failure.  I have recommended that he increase carvedilol back to 6.25 mg twice daily.  If he runs blood pressures consistently less than 100 mmHg systolic then perhaps decreased to 3.125 mg in the morning and 6.25 mg at bedtime.  We need to be at the 6.25 mg twice daily threshold to gain benefit towards recovery of LV function.  4. Smoking cessation has occurred. 5. On current regimen, blood pressure is relatively low.  Continue low-dose Aldactone, Entresto 24/26 mg p.o. twice daily, and Coreg 6.25 mg twice daily.  Continue digoxin for now but may consider discontinuation on next visit.  Follow-up with Norma Fredrickson on March 5.  I will look forward to seeing him in 4-6 weeks.    Medication Adjustments/Labs and Tests Ordered: Current medicines are reviewed at length with the patient today.  Concerns regarding medicines are outlined above.  Medication changes, Labs and Tests ordered today are listed in the Patient Instructions below. Patient Instructions  Medication Instructions:  1) INCREASE Carvedilol to 6.25mg  twice daily  Labwork: None  Testing/Procedures: None  Follow-Up: Your physician recommends that you schedule a follow-up appointment in: 6 weeks with Dr. Katrinka Blazing.   Any Other Special Instructions Will Be Listed Below (If Applicable).     If you need a refill on your cardiac medications before your next appointment, please call your pharmacy.      Signed, Lesleigh Noe, MD  10/03/2017 3:55 PM    [facility] [address] [place], [place], [place]  [address] Phone: [phone]; Fax: [phone]

## 2017-10-03 NOTE — Patient Instructions (Signed)
Medication Instructions:  1) INCREASE Carvedilol to 6.25mg  twice daily  Labwork: None  Testing/Procedures: None  Follow-Up: Your physician recommends that you schedule a follow-up appointment in: 6 weeks with Dr. Katrinka BlazingSmith.   Any Other Special Instructions Will Be Listed Below (If Applicable).     If you need a refill on your cardiac medications before your next appointment, please call your pharmacy.

## 2017-10-07 ENCOUNTER — Ambulatory Visit (HOSPITAL_COMMUNITY)
Admission: RE | Admit: 2017-10-07 | Discharge: 2017-10-07 | Disposition: A | Discharge status: Home / Self Care | Payer: BLUE CROSS/BLUE SHIELD | Source: Ambulatory Visit | Attending: Internal Medicine | Admitting: Internal Medicine

## 2017-10-07 ENCOUNTER — Encounter (HOSPITAL_COMMUNITY)
Admission: RE | Disposition: A | Discharge status: Home / Self Care | Payer: Self-pay | Source: Ambulatory Visit | Attending: Internal Medicine

## 2017-10-07 DIAGNOSIS — Z6834 Body mass index (BMI) 34.0-34.9, adult: Secondary | ICD-10-CM | POA: Insufficient documentation

## 2017-10-07 DIAGNOSIS — I5022 Chronic systolic (congestive) heart failure: Secondary | ICD-10-CM | POA: Insufficient documentation

## 2017-10-07 DIAGNOSIS — E669 Obesity, unspecified: Secondary | ICD-10-CM | POA: Diagnosis not present

## 2017-10-07 DIAGNOSIS — I110 Hypertensive heart disease with heart failure: Secondary | ICD-10-CM | POA: Insufficient documentation

## 2017-10-07 DIAGNOSIS — F329 Major depressive disorder, single episode, unspecified: Secondary | ICD-10-CM | POA: Insufficient documentation

## 2017-10-07 DIAGNOSIS — Z7901 Long term (current) use of anticoagulants: Secondary | ICD-10-CM | POA: Insufficient documentation

## 2017-10-07 DIAGNOSIS — Z882 Allergy status to sulfonamides status: Secondary | ICD-10-CM | POA: Diagnosis not present

## 2017-10-07 DIAGNOSIS — Z87891 Personal history of nicotine dependence: Secondary | ICD-10-CM | POA: Diagnosis not present

## 2017-10-07 DIAGNOSIS — Z79899 Other long term (current) drug therapy: Secondary | ICD-10-CM | POA: Diagnosis not present

## 2017-10-07 DIAGNOSIS — I4892 Unspecified atrial flutter: Secondary | ICD-10-CM | POA: Diagnosis present

## 2017-10-07 DIAGNOSIS — Z881 Allergy status to other antibiotic agents status: Secondary | ICD-10-CM | POA: Diagnosis not present

## 2017-10-07 DIAGNOSIS — G4733 Obstructive sleep apnea (adult) (pediatric): Secondary | ICD-10-CM | POA: Diagnosis not present

## 2017-10-07 DIAGNOSIS — I483 Typical atrial flutter: Secondary | ICD-10-CM | POA: Insufficient documentation

## 2017-10-07 HISTORY — PX: A-FLUTTER ABLATION: EP1230

## 2017-10-07 SURGERY — A-FLUTTER ABLATION
Anesthesia: LOCAL

## 2017-10-07 MED ORDER — MIDAZOLAM HCL 5 MG/5ML IJ SOLN
INTRAMUSCULAR | Status: AC
  Filled 2017-10-07: qty 5

## 2017-10-07 MED ORDER — SODIUM CHLORIDE 0.9% FLUSH: 3.0000 mL | INTRAVENOUS | Status: DC | PRN

## 2017-10-07 MED ORDER — BUPIVACAINE HCL (PF) 0.25 % IJ SOLN
INTRAMUSCULAR | Status: AC
  Filled 2017-10-07: qty 60

## 2017-10-07 MED ORDER — FENTANYL CITRATE (PF) 100 MCG/2ML IJ SOLN
INTRAMUSCULAR | Status: AC
  Filled 2017-10-07: qty 2

## 2017-10-07 MED ORDER — SODIUM CHLORIDE 0.9 % IV SOLN
INTRAVENOUS | Status: DC
  Administered 2017-10-07: 06:00:00 via INTRAVENOUS

## 2017-10-07 MED ORDER — BUPIVACAINE HCL (PF) 0.25 % IJ SOLN
INTRAMUSCULAR | Status: DC | PRN
  Administered 2017-10-07: 60 mL

## 2017-10-07 MED ORDER — ACETAMINOPHEN 325 MG PO TABS
650.0000 mg | ORAL_TABLET | ORAL | Status: DC | PRN
  Filled 2017-10-07: qty 2

## 2017-10-07 MED ORDER — SODIUM CHLORIDE 0.9% FLUSH: 3.0000 mL | Freq: Two times a day (BID) | INTRAVENOUS | Status: DC

## 2017-10-07 MED ORDER — ONDANSETRON HCL 4 MG/2ML IJ SOLN: 4.0000 mg | Freq: Four times a day (QID) | INTRAMUSCULAR | Status: DC | PRN

## 2017-10-07 MED ORDER — FENTANYL CITRATE (PF) 100 MCG/2ML IJ SOLN
INTRAMUSCULAR | Status: DC | PRN
  Administered 2017-10-07 (×3): 12.5 ug via INTRAVENOUS
  Administered 2017-10-07: 25 ug via INTRAVENOUS
  Administered 2017-10-07: 12.5 ug via INTRAVENOUS
  Administered 2017-10-07: 25 ug via INTRAVENOUS
  Administered 2017-10-07 (×3): 12.5 ug via INTRAVENOUS
  Administered 2017-10-07: 25 ug via INTRAVENOUS

## 2017-10-07 MED ORDER — HEPARIN (PORCINE) IN NACL 2-0.9 UNIT/ML-% IJ SOLN
INTRAMUSCULAR | Status: AC | PRN
  Administered 2017-10-07: 500 mL

## 2017-10-07 MED ORDER — MIDAZOLAM HCL 5 MG/5ML IJ SOLN
INTRAMUSCULAR | Status: DC | PRN
  Administered 2017-10-07: 2 mg via INTRAVENOUS
  Administered 2017-10-07 (×8): 1 mg via INTRAVENOUS
  Administered 2017-10-07: 2 mg via INTRAVENOUS
  Administered 2017-10-07: 1 mg via INTRAVENOUS

## 2017-10-07 MED ORDER — HEPARIN (PORCINE) IN NACL 2-0.9 UNIT/ML-% IJ SOLN
INTRAMUSCULAR | Status: AC
  Filled 2017-10-07: qty 500

## 2017-10-07 MED ORDER — SODIUM CHLORIDE 0.9 % IV SOLN: 250.0000 mL | INTRAVENOUS | Status: DC | PRN

## 2017-10-07 SURGICAL SUPPLY — 10 items
BAG SNAP BAND KOVER 36X36 (MISCELLANEOUS) ×2 IMPLANT
CATH BLAZERPRIME XP (ABLATOR) ×2 IMPLANT
CATH DUODECA HALO/ISMUS 7FR (CATHETERS) ×2 IMPLANT
CATH JOSEPHSON QUAD-ALLRED 6FR (CATHETERS) ×2 IMPLANT
CATH POLARIS X 2.5/5/2.5 DECAP (CATHETERS) ×2 IMPLANT
PACK EP LATEX FREE (CUSTOM PROCEDURE TRAY) ×1
PACK EP LF (CUSTOM PROCEDURE TRAY) ×1 IMPLANT
PAD DEFIB LIFELINK (PAD) ×2 IMPLANT
SHEATH PINNACLE 6F 10CM (SHEATH) ×2 IMPLANT
SHEATH PINNACLE 8F 10CM (SHEATH) ×4 IMPLANT

## 2017-10-07 NOTE — Interval H&P Note (Signed)
History and Physical Interval Note:  10/07/2017 8:02 AM  Cory MarketAlfred B Strupp Jr.  has presented today for surgery, with the diagnosis of a flutter  The various methods of treatment have been discussed with the patient and family. After consideration of risks, benefits and other options for treatment, the patient has consented to  Procedure(s): A-FLUTTER ABLATION (N/A) as a surgical intervention .  The patient's history has been reviewed, patient examined, no change in status, stable for surgery.  I have reviewed the patient's chart and labs.  Questions were answered to the patient's satisfaction.     Lewayne BuntingGregg Taylor

## 2017-10-07 NOTE — Progress Notes (Signed)
Site area: RFV x 3 Site Prior to Removal:  Level 0 Pressure Applied For: 20 minutes Manual:   yes Patient Status During Pull:  stable Post Pull Site:  Level 0 Post Pull Instructions Given: yes  Post Pull Pulses Present: palpable Dressing Applied:  tegaderm Bedrest begins @ 1015 Comments:

## 2017-10-07 NOTE — Discharge Instructions (Signed)

## 2017-10-08 ENCOUNTER — Encounter (HOSPITAL_COMMUNITY): Payer: Self-pay | Admitting: Internal Medicine

## 2017-10-09 ENCOUNTER — Ambulatory Visit: Payer: BLUE CROSS/BLUE SHIELD | Admitting: Nurse Practitioner

## 2017-10-15 ENCOUNTER — Ambulatory Visit (INDEPENDENT_AMBULATORY_CARE_PROVIDER_SITE_OTHER): Payer: BLUE CROSS/BLUE SHIELD | Admitting: Nurse Practitioner

## 2017-10-15 ENCOUNTER — Encounter: Payer: Self-pay | Admitting: Nurse Practitioner

## 2017-10-15 VITALS — BP 108/80 | HR 90 | Ht 72.0 in | Wt 256.0 lb

## 2017-10-15 DIAGNOSIS — I4892 Unspecified atrial flutter: Secondary | ICD-10-CM

## 2017-10-15 DIAGNOSIS — G4733 Obstructive sleep apnea (adult) (pediatric): Secondary | ICD-10-CM | POA: Diagnosis not present

## 2017-10-15 DIAGNOSIS — I5022 Chronic systolic (congestive) heart failure: Secondary | ICD-10-CM

## 2017-10-15 DIAGNOSIS — I10 Essential (primary) hypertension: Secondary | ICD-10-CM

## 2017-10-15 DIAGNOSIS — Z8679 Personal history of other diseases of the circulatory system: Secondary | ICD-10-CM | POA: Diagnosis not present

## 2017-10-15 DIAGNOSIS — Z9889 Other specified postprocedural states: Secondary | ICD-10-CM | POA: Diagnosis not present

## 2017-10-15 NOTE — Progress Notes (Signed)
CARDIOLOGY OFFICE NOTE  Date:  10/15/2017    Cory Jenkins. Date of Birth: Oct 02, 1965 Medical Record #[id number]  PCP:  Patient, No Pcp Per  Cardiologist:  [facility]    Chief Complaint  Patient presents with  . Congestive Heart Failure  . Atrial Flutter    Post hospital/follow up visit - seen for Dr. Anastasia Fiedler    History of Present Illness: Cory Jenkins. is a 52 y.o. male who presents today for a follow up/post hospital visit. Seen for Dr. Katrinka Blazing and Dr. Ladona Ridgel.   He has a newly diagnosed atrial flutter and cardiomyopathy September 06, 2017. Echo showed severely reduced LV function; mild to moderate MR; moderate TR; moderately reduced RV function; small pericardial effusion.  Underwent electrical cardioversion 09/09/2017 and subsequent optimization of therapy to improve LV systolic dysfunction. He has not had ischemic workup.  Has not had MRI to look for evidence of infiltrative/inflammatory disease.  Saw electrophysiology/Dr. Ladona Ridgel and has had recent atrial flutter ablation. He has stopped smoking.   Last seen by Dr. Katrinka Blazing about 3 weeks ago - doing well. Ablation was last week - this went well.   Comes in today. Here alone today. He is doing "just great". Says he feels the best now he has felt in quite some time. Asking for a letter stating that the week he was out of work prior to the admission in January was related to heart failure - very reasonable and I have given him a letter stating this today. Currently not short of breath. No chest pain. Very [place] dizziness - has been able to get to 6.25 mg of Coreg BID. No palpitations. Asking about timing of repeat echo. Was able to get his Sherryll Burger basically for free after discussion with [employer] and with our office's help. He is quite grateful and really has no concerns today.  Medicines this morning taken about 2 hours prior to the visit.   Past Medical History:  Diagnosis Date  . Depression   . Hypertension     . New onset atrial flutter (HCC) 09/06/2017  . OSA (obstructive sleep apnea)    "never ordered mask" (09/06/2017)    Past Surgical History:  Procedure Laterality Date  . A-FLUTTER ABLATION N/A 10/07/2017   Procedure: A-FLUTTER ABLATION;  Surgeon: Marinus Maw, MD;  Location: [facility] INVASIVE CV LAB;  Service: Cardiovascular;  Laterality: N/A;  . CARDIOVERSION N/A 09/09/2017   Procedure: CARDIOVERSION;  Surgeon: Wendall Stade, MD;  Location: [facility] ENDOSCOPY;  Service: Cardiovascular;  Laterality: N/A;  . HERNIA REPAIR    . LAPAROSCOPIC GASTRIC BANDING  ~ 2009  . TEE WITHOUT CARDIOVERSION N/A 09/09/2017   Procedure: TRANSESOPHAGEAL ECHOCARDIOGRAM (TEE);  Surgeon: Wendall Stade, MD;  Location: [facility] ENDOSCOPY;  Service: Cardiovascular;  Laterality: N/A;  . TESTICLE REMOVAL Left ~ 2007  . UMBILICAL HERNIA REPAIR  ~ 2009     Medications: Current Meds  Medication Sig  . apixaban (ELIQUIS) 5 MG TABS tablet Take 1 tablet (5 mg total) by mouth 2 (two) times daily.  . carvedilol (COREG) 6.25 MG tablet Take 1 tablet (6.25 mg total) by mouth 2 (two) times daily.  . digoxin (LANOXIN) 0.125 MG tablet Take 1 tablet (0.125 mg total) by mouth daily.  . furosemide (LASIX) 40 MG tablet Take 1 tablet (40 mg total) by mouth daily.  . nortriptyline (PAMELOR) 25 MG capsule Take 75 mg by mouth at bedtime.   . sacubitril-valsartan (ENTRESTO) 24-26 MG Take  1 tablet by mouth 2 (two) times daily.  Marland Kitchen spironolactone (ALDACTONE) 25 MG tablet Take 0.5 tablets (12.5 mg total) by mouth daily.  Marland Kitchen zolpidem (AMBIEN) 10 MG tablet Take 10 mg by mouth at bedtime.      Allergies: Allergies  Allergen Reactions  . Keflex [Cephalexin] Other (See Comments)    Abdominal pain  . Sulfa Antibiotics Hives    Social History: The patient  reports that he quit smoking about 5 weeks ago. His smoking use included cigarettes. He has a 35.00 pack-year smoking history. he has never used smokeless tobacco. He reports that he does not  drink alcohol or use drugs.   Family History: The patient's family history includes Healthy in his father.   Review of Systems: Please see the history of present illness.   Otherwise, the review of systems is positive for none.   All other systems are reviewed and negative.   Physical Exam: VS:  BP 108/80 (BP Location: Left Arm, Patient Position: Sitting, Cuff Size: Normal)   Pulse 90   Ht 6' (1.829 m)   Wt 256 lb (116.1 kg)   BMI 34.72 kg/m  .  BMI Body mass index is 34.72 kg/m.  Wt Readings from Last 3 Encounters:  10/15/17 256 lb (116.1 kg)  10/07/17 250 lb 11.2 oz (113.7 kg)  10/03/17 256 lb (116.1 kg)    General: Pleasant. Well developed, well nourished and in no acute distress.   HEENT: Normal.  Neck: Supple, no JVD, carotid bruits, or masses noted.  Cardiac: Regular rate and rhythm. No murmurs, rubs, or gallops. No edema.  Respiratory:  Lungs are clear to auscultation bilaterally with normal work of breathing.  GI: Soft and nontender.  MS: No deformity or atrophy. Gait and ROM intact.  Skin: Warm and dry. Color is normal.  Neuro:  Strength and sensation are intact and no gross focal deficits noted.  Psych: Alert, appropriate and with normal affect.   LABORATORY DATA:  EKG:  EKG is ordered today. This demonstrates sinus rhythm - nonspecific T wave changes.  Lab Results  Component Value Date   WBC 8.9 10/02/2017   HGB 18.6 (H) 10/02/2017   HCT 52.4 (H) 10/02/2017   PLT 174 10/02/2017   GLUCOSE 222 (H) 10/02/2017   CHOL 142 09/07/2017   TRIG 96 09/07/2017   HDL 27 (L) 09/07/2017   LDLCALC 96 09/07/2017   ALT 13 (L) 09/06/2017   AST 24 09/06/2017   NA 137 10/02/2017   K 5.1 10/02/2017   CL 98 10/02/2017   CREATININE 1.11 10/02/2017   BUN 19 10/02/2017   CO2 24 10/02/2017   TSH 2.089 09/06/2017   HGBA1C 6.4 (H) 09/06/2017     BNP (last 3 results) Recent Labs    09/06/17 1850  BNP 842.0*    ProBNP (last 3 results) Recent Labs    09/17/17 1232    PROBNP 471*     Other Studies Reviewed Today:  Atrial Flutter Ablation Conclusion 10/07/2017  CONCLUSIONS:  1. Isthmus-dependent right atrial flutter previously documented.  2. Successful radiofrequency ablation of atrial flutter along the cavotricuspid isthmus with complete bidirectional isthmus block achieved.  3. No inducible arrhythmias following ablation.  4. No early apparent complications.   Lewayne Bunting, MD  10:03 AM 10/07/2017    Echocardiogram 09/06/2017: Study Conclusions  - Left ventricle: The cavity size was normal. Wall thickness was normal. The estimated ejection fraction was in the range of 10% to 15%. Diffuse hypokinesis. The study is  not technically sufficient to allow evaluation of LV diastolic function. - Regional wall motion abnormality: Akinesis of the basal-mid anteroseptal myocardium. - Aortic valve: Valve area (VTI): 2.73 cm^2. Valve area (Vmax): 3.02 cm^2. - Mitral valve: There was mild regurgitation. - Left atrium: The atrium was severely dilated. - Right ventricle: The cavity size was mildly dilated. Systolic function was moderately reduced. - Right atrium: The atrium was moderately dilated. - Atrial septum: No defect or patent foramen ovale was identified. - Tricuspid valve: There was mild-moderate regurgitation. - Pulmonary arteries: Systolic pressure was moderately increased. PA peak pressure: 56 mm Hg (S).  Assessment/Plan:  1. Atrial flutter - s/p cardioversion - and now s/p ablation - remains in sinus by exam and by EKG - seeing Dr. Ladona Ridgelaylor later this month.   2. Chronic systolic HF - EF of 10 to 15% by echo and 20% at time of TEE. He is on Entresto, diuretics, lanoxin, aldactone and low dose beta blocker. He has had some dizziness/orthostasis with trying to up titrate his medicines. Will arrange for repeat echo later this month and based on those findings - then consider possible cath/cardiac MRI/ICD.  He is doing very well  clinically. Lab today. Letter given today as well per above discussion.   3. Tobacco abuse - not smoking  4. Probably OSA - says he has had prior sleep study - could not wear mask - maybe we need to see if there is some other option for him going forward. To discuss on return.   5. Chronic anticoagulation - no problems noted. Lab today  6. High risk medicine - lab today.    Current medicines are reviewed with the patient today.  The patient does not have concerns regarding medicines other than what has been noted above.  The following changes have been made:  See above.  Labs/ tests ordered today include:   No orders of the defined types were placed in this encounter.    Disposition:   FU with Dr. Katrinka BlazingSmith and Dr. Ladona Ridgelaylor as previously arranged. I will be happy to see as needed.   Patient is agreeable to this plan and will call if any problems develop in the interim.   SignedNorma Fredrickson: Jaishon Krisher, NP  10/15/2017 9:36 AM  [facility] [address] [place], [place]  [address] Phone: [phone] Fax: [phone]

## 2017-10-15 NOTE — Patient Instructions (Addendum)
We will be checking the following labs today - BMET, CBC and dig level   Medication Instructions:    Continue with your current medicines.     Testing/Procedures To Be Arranged:  Echo at the end of this month  Follow-Up:   See Dr. Ladona Ridgelaylor and Dr. Katrinka BlazingSmith as planned    Other Special Instructions:   N/A    If you need a refill on your cardiac medications before your next appointment, please call your pharmacy.   Call the [facility] office at [phone] if you have any questions, problems or concerns.

## 2017-10-16 LAB — CBC
Hematocrit: 47.9 % (ref 37.5–51.0)
Hemoglobin: 17 g/dL (ref 13.0–17.7)
MCH: 32 pg (ref 26.6–33.0)
MCHC: 35.5 g/dL (ref 31.5–35.7)
MCV: 90 fL (ref 79–97)
Platelets: 162 10*3/uL (ref 150–379)
RBC: 5.32 x10E6/uL (ref 4.14–5.80)
RDW: 13.6 % (ref 12.3–15.4)
WBC: 7.5 10*3/uL (ref 3.4–10.8)

## 2017-10-16 LAB — BASIC METABOLIC PANEL
BUN/Creatinine Ratio: 17 (ref 9–20)
BUN: 19 mg/dL (ref 6–24)
CO2: 26 mmol/L (ref 20–29)
Calcium: 9.6 mg/dL (ref 8.7–10.2)
Chloride: 100 mmol/L (ref 96–106)
Creatinine, Ser: 1.15 mg/dL (ref 0.76–1.27)
GFR calc Af Amer: 85 mL/min/{1.73_m2} (ref >59)
GFR calc non Af Amer: 73 mL/min/{1.73_m2} (ref >59)
Glucose: 188 mg/dL — ABNORMAL HIGH (ref 65–99)
Potassium: 4.2 mmol/L (ref 3.5–5.2)
Sodium: 140 mmol/L (ref 134–144)

## 2017-10-16 LAB — DIGOXIN LEVEL: Digoxin, Serum: 0.9 ng/mL (ref 0.5–0.9)

## 2017-10-25 ENCOUNTER — Telehealth: Payer: Self-pay | Admitting: Internal Medicine

## 2017-10-25 NOTE — Telephone Encounter (Signed)
New message   Sallye OberLouise from [employer] (short term disability) calling to verify  procedure dates and work restrictions Please call [phone] ext [phone]  Fax # [phone] Attn: Sallye OberLouise

## 2017-10-25 NOTE — Telephone Encounter (Signed)
This request will need to come from our Medical records dept, to adhere to policy. Will forward to Selena BattenKim, in Medical Records to see if assistance with this request can be provided.

## 2017-10-29 ENCOUNTER — Telehealth: Payer: Self-pay | Admitting: Nurse Practitioner

## 2017-10-29 NOTE — Telephone Encounter (Signed)
Cory Jenkins calling with [employer] to discuss patient short-term disability paperwork.

## 2017-10-29 NOTE — Telephone Encounter (Signed)
Called left vm with Orbie PyoLouise Gardner [employer] made her aware I cannot release any dates of service to her.  If she is looking for dates it will be on patients paperwork once completed. Left my call back number if she has questions.

## 2017-11-06 ENCOUNTER — Ambulatory Visit (HOSPITAL_COMMUNITY): Payer: BLUE CROSS/BLUE SHIELD | Attending: Cardiology

## 2017-11-06 ENCOUNTER — Other Ambulatory Visit: Payer: Self-pay | Admitting: Nurse Practitioner

## 2017-11-06 ENCOUNTER — Encounter: Payer: Self-pay | Admitting: Internal Medicine

## 2017-11-06 ENCOUNTER — Other Ambulatory Visit: Payer: Self-pay

## 2017-11-06 ENCOUNTER — Ambulatory Visit (INDEPENDENT_AMBULATORY_CARE_PROVIDER_SITE_OTHER): Payer: BLUE CROSS/BLUE SHIELD | Admitting: Internal Medicine

## 2017-11-06 VITALS — BP 120/60 | HR 93 | Ht 72.0 in | Wt 255.0 lb

## 2017-11-06 DIAGNOSIS — I10 Essential (primary) hypertension: Secondary | ICD-10-CM

## 2017-11-06 DIAGNOSIS — I5022 Chronic systolic (congestive) heart failure: Secondary | ICD-10-CM | POA: Diagnosis not present

## 2017-11-06 DIAGNOSIS — Z8679 Personal history of other diseases of the circulatory system: Secondary | ICD-10-CM

## 2017-11-06 DIAGNOSIS — I5030 Unspecified diastolic (congestive) heart failure: Secondary | ICD-10-CM | POA: Diagnosis not present

## 2017-11-06 DIAGNOSIS — I4892 Unspecified atrial flutter: Secondary | ICD-10-CM

## 2017-11-06 DIAGNOSIS — G4733 Obstructive sleep apnea (adult) (pediatric): Secondary | ICD-10-CM

## 2017-11-06 DIAGNOSIS — Z9889 Other specified postprocedural states: Secondary | ICD-10-CM | POA: Diagnosis not present

## 2017-11-06 LAB — ECHOCARDIOGRAM LIMITED
Height: 72 in
Weight: 4080 oz

## 2017-11-06 NOTE — Progress Notes (Signed)
HPI Mr. Cory Jenkins returns today after undergoing EP Study and ablation of atrial flutter. He presented 2 months ago with CHF and was in atrial flutter with a RVR. He was cardioverted and placed on medical therapy and returned a few weeks ago and underwent EP study and ablation of typical atrial flutter. He feels well. He is back exercising. No chest pain or sob. No syncope. No edema.  Allergies  Allergen Reactions  . Keflex [Cephalexin] Other (See Comments)    Abdominal pain  . Sulfa Antibiotics Hives     Current Outpatient Medications  Medication Sig Dispense Refill  . apixaban (ELIQUIS) 5 MG TABS tablet Take 1 tablet (5 mg total) by mouth 2 (two) times daily. 60 tablet 0  . carvedilol (COREG) 6.25 MG tablet Take 1 tablet (6.25 mg total) by mouth 2 (two) times daily. 180 tablet 3  . digoxin (LANOXIN) 0.125 MG tablet Take 1 tablet (0.125 mg total) by mouth daily. 30 tablet 11  . furosemide (LASIX) 40 MG tablet Take 1 tablet (40 mg total) by mouth daily. 30 tablet 11  . nortriptyline (PAMELOR) 25 MG capsule Take 75 mg by mouth at bedtime.     . sacubitril-valsartan (ENTRESTO) 24-26 MG Take 1 tablet by mouth 2 (two) times daily. 60 tablet 0  . spironolactone (ALDACTONE) 25 MG tablet Take 0.5 tablets (12.5 mg total) by mouth daily. 30 tablet 11  . zolpidem (AMBIEN) 10 MG tablet Take 10 mg by mouth at bedtime.      No current facility-administered medications for this visit.      Past Medical History:  Diagnosis Date  . Depression   . Hypertension   . New onset atrial flutter (HCC) 09/06/2017  . OSA (obstructive sleep apnea)    "never ordered mask" (09/06/2017)    ROS:   All systems reviewed and negative except as noted in the HPI.   Past Surgical History:  Procedure Laterality Date  . A-FLUTTER ABLATION N/A 10/07/2017   Procedure: A-FLUTTER ABLATION;  Surgeon: Cory Maw, MD;  Location: [facility] INVASIVE CV LAB;  Service: Cardiovascular;  Laterality: N/A;  . CARDIOVERSION  N/A 09/09/2017   Procedure: CARDIOVERSION;  Surgeon: Cory Stade, MD;  Location: [facility] ENDOSCOPY;  Service: Cardiovascular;  Laterality: N/A;  . HERNIA REPAIR    . LAPAROSCOPIC GASTRIC BANDING  ~ 2009  . TEE WITHOUT CARDIOVERSION N/A 09/09/2017   Procedure: TRANSESOPHAGEAL ECHOCARDIOGRAM (TEE);  Surgeon: Cory Stade, MD;  Location: [facility] ENDOSCOPY;  Service: Cardiovascular;  Laterality: N/A;  . TESTICLE REMOVAL Left ~ 2007  . UMBILICAL HERNIA REPAIR  ~ 2009     Family History  Problem Relation Age of Onset  . Healthy Father      Social History   Socioeconomic History  . Marital status: Married    Spouse name: Not on file  . Number of children: Not on file  . Years of education: Not on file  . Highest education level: Not on file  Occupational History  . Not on file  Social Needs  . Financial resource strain: Not on file  . Food insecurity:    Worry: Not on file    Inability: Not on file  . Transportation needs:    Medical: Not on file    Non-medical: Not on file  Tobacco Use  . Smoking status: Former Smoker    Packs/day: 1.00    Years: 35.00    Pack years: 35.00    Types: Cigarettes  Last attempt to quit: 09/06/2017    Years since quitting: 0.1  . Smokeless tobacco: Never Used  Substance and Sexual Activity  . Alcohol use: No    Alcohol/week: 0.0 oz  . Drug use: No  . Sexual activity: Yes  Lifestyle  . Physical activity:    Days per week: Not on file    Minutes per session: Not on file  . Stress: Not on file  Relationships  . Social connections:    Talks on phone: Not on file    Gets together: Not on file    Attends religious service: Not on file    Active member of club or organization: Not on file    Attends meetings of clubs or organizations: Not on file    Relationship status: Not on file  . Intimate partner violence:    Fear of current or ex partner: Not on file    Emotionally abused: Not on file    Physically abused: Not on file    Forced  sexual activity: Not on file  Other Topics Concern  . Not on file  Social History Narrative  . Not on file     BP 120/60   Pulse 93   Ht 6' (1.829 m)   Wt 255 lb (115.7 kg)   BMI 34.58 kg/m   Physical Exam:  Well appearing 52 yo man, NAD HEENT: Unremarkable Neck:  6 cm JVD, no thyromegally Lymphatics:  No adenopathy Back:  No CVA tenderness Lungs:  Clear with no wheezes HEART:  Regular rate rhythm, no murmurs, no rubs, no clicks Abd:  soft, positive bowel sounds, no organomegally, no rebound, no guarding Ext:  2 plus pulses, no edema, no cyanosis, no clubbing Skin:  No rashes no nodules Neuro:  CN II through XII intact, motor grossly intact  EKG - NSR  Assess/Plan: 1. Atrial flutter - he appears to be maintaining NSR after ablation. He did have some atrial fib during his ablation of unclear clinical significance. He will stop his blood thinner today if his EF is normal. 2. Chronic systolic heart failure - his symptoms are class 1. Now that he is in NSR and on medical therapy, hopefully his EF will have normalized.  3. Coags - as above. If his EF is normal and now in NSR, I would expect that we stop his systemic anticoagulation.  Cory ReevesGregg Jenkins,M.D.

## 2017-11-06 NOTE — Patient Instructions (Signed)

## 2017-11-11 ENCOUNTER — Telehealth: Payer: Self-pay | Admitting: Nurse Practitioner

## 2017-11-11 NOTE — Telephone Encounter (Signed)
New Message ° ° ° °Patient is returning call in reference to echocardiogram results. Please call to discuss.  °

## 2017-11-14 ENCOUNTER — Encounter: Payer: Self-pay | Admitting: Interventional Cardiology

## 2017-11-14 ENCOUNTER — Ambulatory Visit (INDEPENDENT_AMBULATORY_CARE_PROVIDER_SITE_OTHER): Payer: BLUE CROSS/BLUE SHIELD | Admitting: Interventional Cardiology

## 2017-11-14 VITALS — BP 98/70 | HR 103 | Ht 72.0 in | Wt 256.2 lb

## 2017-11-14 DIAGNOSIS — I483 Typical atrial flutter: Secondary | ICD-10-CM | POA: Diagnosis not present

## 2017-11-14 DIAGNOSIS — I5042 Chronic combined systolic (congestive) and diastolic (congestive) heart failure: Secondary | ICD-10-CM | POA: Diagnosis not present

## 2017-11-14 DIAGNOSIS — G4733 Obstructive sleep apnea (adult) (pediatric): Secondary | ICD-10-CM

## 2017-11-14 DIAGNOSIS — I10 Essential (primary) hypertension: Secondary | ICD-10-CM

## 2017-11-14 MED ORDER — IVABRADINE HCL 5 MG PO TABS: 5.0000 mg | ORAL_TABLET | Freq: Two times a day (BID) | ORAL | 3 refills | Status: DC

## 2017-11-14 NOTE — Patient Instructions (Addendum)
Medication Instructions:  1) START Corlanor 5mg  twice daily  Labwork: None  Testing/Procedures: Your physician has requested that you have an echocardiogram just prior to seeing Dr. Katrinka BlazingSmith back. Echocardiography is a painless test that uses sound waves to create images of your heart. It provides your doctor with information about the size and shape of your heart and how well your heart's chambers and valves are working. This procedure takes approximately one hour. There are no restrictions for this procedure.    Follow-Up:  Your physician recommends that you schedule a follow-up appointment in: 2 weeks with Norma FredricksonLori Gerhardt, NP or Nada BoozerLaura Ingold, NP.   Your physician recommends that you schedule a follow-up appointment in: 3-4 months with Dr. Katrinka BlazingSmith.    Any Other Special Instructions Will Be Listed Below (If Applicable).     If you need a refill on your cardiac medications before your next appointment, please call your pharmacy.

## 2017-11-14 NOTE — Progress Notes (Signed)
Cardiology Office Note    Date:  11/14/2017   ID:  Cory MarketAlfred B Pecore Jr., DOB [birth year], MRN [id number]  PCP:  Patient, No Pcp Per  Cardiologist: Lesleigh NoeHenry W Chaunce Winkels III, MD   Chief Complaint  Patient presents with  . Congestive Heart Failure    History of Present Illness:  Cory Marketlfred B Neumeier Jr. is a 52 y.o. male  with newly diagnosed atrial flutter and cardiomyopathy. Echo shows severely reduced LV function; mild to moderate MR; moderate TR; moderately reduced RV function; small pericardial effusion.  Now status post a flutter ablation.  Moderate dose heart failure therapy has been started.  Tab is doing better.  He was able to mow his yard..  He had successful a flutter ablation.  Recent echo ordered by Lawson FiscalLori demonstrates that LVEF is now 40-50%.  Echo was personally reviewed.  He has no symptoms of dyspnea.  He feels back to baseline.  Denies orthopnea and PND.  No chest pain.   Past Medical History:  Diagnosis Date  . Depression   . Hypertension   . New onset atrial flutter (HCC) 09/06/2017  . OSA (obstructive sleep apnea)    "never ordered mask" (09/06/2017)    Past Surgical History:  Procedure Laterality Date  . A-FLUTTER ABLATION N/A 10/07/2017   Procedure: A-FLUTTER ABLATION;  Surgeon: Marinus Mawaylor, Gregg W, MD;  [facility];  Service: Cardiovascular;  Laterality: N/A;  . CARDIOVERSION N/A 09/09/2017   Procedure: CARDIOVERSION;  Surgeon: Wendall StadeNishan, Peter C, MD;  [facility];  Service: Cardiovascular;  Laterality: N/A;  . HERNIA REPAIR    . LAPAROSCOPIC GASTRIC BANDING  ~ 2009  . TEE WITHOUT CARDIOVERSION N/A 09/09/2017   Procedure: TRANSESOPHAGEAL ECHOCARDIOGRAM (TEE);  Surgeon: Wendall StadeNishan, Peter C, MD;  [facility];  Service: Cardiovascular;  Laterality: N/A;  . TESTICLE REMOVAL Left ~ 2007  . UMBILICAL HERNIA REPAIR  ~ 2009    Current Medications: Outpatient Medications Prior to Visit  Medication Sig Dispense Refill  . apixaban (ELIQUIS) 5 MG TABS  tablet Take 1 tablet (5 mg total) by mouth 2 (two) times daily. 60 tablet 0  . carvedilol (COREG) 6.25 MG tablet Take 1 tablet (6.25 mg total) by mouth 2 (two) times daily. 180 tablet 3  . digoxin (LANOXIN) 0.125 MG tablet Take 1 tablet (0.125 mg total) by mouth daily. 30 tablet 11  . furosemide (LASIX) 40 MG tablet Take 1 tablet (40 mg total) by mouth daily. 30 tablet 11  . nortriptyline (PAMELOR) 25 MG capsule Take 75 mg by mouth at bedtime.     . sacubitril-valsartan (ENTRESTO) 24-26 MG Take 1 tablet by mouth 2 (two) times daily. 60 tablet 0  . spironolactone (ALDACTONE) 25 MG tablet Take 0.5 tablets (12.5 mg total) by mouth daily. 30 tablet 11  . zolpidem (AMBIEN) 10 MG tablet Take 10 mg by mouth at bedtime.      No facility-administered medications prior to visit.      Allergies:   Keflex [cephalexin] and Sulfa antibiotics   Social History   Socioeconomic History  . Marital status: Married    Spouse name: Not on file  . Number of children: Not on file  . Years of education: Not on file  . Highest education level: Not on file  Occupational History  . Not on file  Social Needs  . Financial resource strain: Not on file  . Food insecurity:    Worry: Not on file    Inability: Not on file  .  Transportation needs:    Medical: Not on file    Non-medical: Not on file  Tobacco Use  . Smoking status: Former Smoker    Packs/day: 1.00    Years: 35.00    Pack years: 35.00    Types: Cigarettes    Last attempt to quit: 09/06/2017    Years since quitting: 0.1  . Smokeless tobacco: Never Used  Substance and Sexual Activity  . Alcohol use: No    Alcohol/week: 0.0 oz  . Drug use: No  . Sexual activity: Yes  Lifestyle  . Physical activity:    Days per week: Not on file    Minutes per session: Not on file  . Stress: Not on file  Relationships  . Social connections:    Talks on phone: Not on file    Gets together: Not on file    Attends religious service: Not on file    Active  member of club or organization: Not on file    Attends meetings of clubs or organizations: Not on file    Relationship status: Not on file  Other Topics Concern  . Not on file  Social History Narrative  . Not on file     Family History:  The patient's family history includes Healthy in his father.   ROS:   Please see the history of present illness.    none  All other systems reviewed and are negative.   PHYSICAL EXAM:   VS:  BP 98/70   Pulse (!) 103   Ht 6' (1.829 m)   Wt 256 lb 3.2 oz (116.2 kg)   SpO2 97%   BMI 34.75 kg/m    GEN: Well nourished, well developed, in no acute distress  HEENT: normal  Neck: no JVD, carotid bruits, or masses Cardiac: RRR; no murmurs, rubs, or gallops,no edema  Respiratory:  clear to auscultation bilaterally, normal work of breathing GI: soft, nontender, nondistended, + BS MS: no deformity or atrophy  Skin: warm and dry, no rash Neuro:  Alert and Oriented x 3, Strength and sensation are intact Psych: euthymic mood, full affect  Wt Readings from Last 3 Encounters:  11/14/17 256 lb 3.2 oz (116.2 kg)  11/06/17 255 lb (115.7 kg)  10/15/17 256 lb (116.1 kg)      Studies/Labs Reviewed:   EKG:  EKG  Sinus tachycardia at 102 bpm with nonspecific ST-T abnormality.  Recent Labs: 09/06/2017: ALT 13; B Natriuretic Peptide 842.0; Magnesium 1.6; TSH 2.089 09/17/2017: NT-Pro BNP 471 10/15/2017: BUN 19; Creatinine, Ser 1.15; Hemoglobin 17.0; Platelets 162; Potassium 4.2; Sodium 140   Lipid Panel    Component Value Date/Time   CHOL 142 09/07/2017 0626   TRIG 96 09/07/2017 0626   HDL 27 (L) 09/07/2017 0626   CHOLHDL 5.3 09/07/2017 0626   VLDL 19 09/07/2017 0626   LDLCALC 96 09/07/2017 0626    Additional studies/ records that were reviewed today include:  2D Doppler echocardiogram March 2019: Study Conclusions   - Left ventricle: The cavity size was normal. Wall thickness was   increased in a pattern of moderate LVH. Systolic function was    mildly to moderately reduced. The estimated ejection fraction was   42%. Diffuse hypokinesis. Doppler parameters are consistent with   abnormal left ventricular relaxation (grade 1 diastolic   dysfunction). The E/e&' ratio is between 8-15, suggesting   indeterminate LV filling pressure. Ejection fraction (MOD,   2-plane): 42%.   Impressions:   - Compared to a prior study in  08/2017, the LVEF has improved from   20-25% up to 42%.   ASSESSMENT:    1. Chronic combined systolic and diastolic heart failure (HCC)   2. OSA (obstructive sleep apnea)   3. Essential hypertension   4. Morbid obesity due to excess calories (HCC)   5. Typical atrial flutter (HCC)      PLAN:  In order of problems listed above:  1. Concerned about the increased heart rate.  LV has improved.  We will add Corlanor to further slow heart rate and hopefully improve recovery.  2D echocardiogram will be repeated in 3-4 months.  2-week follow-up for heart rate check. 2. CPAP should be worn. 3. Blood pressure is low normal running between 95 and 105 mmHg systolic. 4. Significant and not improved. 5. Ablation was successful.  Clinical follow-up in 3 months.  2-week follow-up for heart rate check.  Samples of: Corlanor have been provided.    Medication Adjustments/Labs and Tests Ordered: Current medicines are reviewed at length with the patient today.  Concerns regarding medicines are outlined above.  Medication changes, Labs and Tests ordered today are listed in the Patient Instructions below. Patient Instructions  Medication Instructions:  1) START Corlanor 5mg  twice daily  Labwork: None  Testing/Procedures: Your physician has requested that you have an echocardiogram just prior to seeing Dr. Katrinka Blazing back. Echocardiography is a painless test that uses sound waves to create images of your heart. It provides your doctor with information about the size and shape of your heart and how well your heart's chambers and  valves are working. This procedure takes approximately one hour. There are no restrictions for this procedure.    Follow-Up:  Your physician recommends that you schedule a follow-up appointment in: 2 weeks with Norma Fredrickson, NP or Nada Boozer, NP.   Your physician recommends that you schedule a follow-up appointment in: 3-4 months with Dr. Katrinka Blazing.    Any Other Special Instructions Will Be Listed Below (If Applicable).     If you need a refill on your cardiac medications before your next appointment, please call your pharmacy.      Signed, Lesleigh Noe, MD  11/14/2017 2:30 PM    [facility] [address] [place], [place], [place]  [address] Phone: [phone]; Fax: [phone]

## 2017-11-27 NOTE — Progress Notes (Signed)
CARDIOLOGY OFFICE NOTE  Date:  11/28/2017    Cory Jenkins. Date of Birth: 06-24-1966 Medical Record #[id number]  PCP:  Patient, No Pcp Per  Cardiologist:  Tyrone Sage & Smith/Taylor   Chief Complaint  Patient presents with  . Congestive Heart Failure    2 week check - seen for Dr. Katrinka Blazing    History of Present Illness: Cory Jenkins. "Cory Jenkins" is a 52 y.o. male who presents today for a follow up visit. This is a 2 week check. Seen for Dr. Katrinka Blazing.  He has a newly diagnosed atrial flutter and cardiomyopathy. Echo showed severely reduced LV function; mild to moderate MR; moderate TR; moderately reduced RV function; small pericardial effusion.  Now status post a flutter ablation.  Moderate dose heart failure therapy has been started - has been limited by soft BP. Did not have an ischemic workup with cath/stress testing.   Seen 2 weeks ago by Dr. Katrinka Blazing - was doing well. Had had successful AFl ablation. Follow up echo with EF up to 40 to 50%. He was feeling back to his baseline. HR was up - Corlanor was started.   Comes in today. Here alone. He notes he is doing well. He has had some visual disturbances since starting the Corlanor - happens most mornings - described as "trailing". BP remains soft. HR has come down. We looked at his HR tracker off his watch - see below. He is also on 75 mg of Pamelor which can cause tachycardia - he is on this for depression and does not wish to change if at all possible. He is otherwise doing well.   Past Medical History:  Diagnosis Date  . Depression   . Hypertension   . New onset atrial flutter (HCC) 09/06/2017  . OSA (obstructive sleep apnea)    "never ordered mask" (09/06/2017)    Past Surgical History:  Procedure Laterality Date  . A-FLUTTER ABLATION N/A 10/07/2017   Procedure: A-FLUTTER ABLATION;  Surgeon: Marinus Maw, MD;  Location: [facility] INVASIVE CV LAB;  Service: Cardiovascular;  Laterality: N/A;  . CARDIOVERSION N/A 09/09/2017   Procedure: CARDIOVERSION;  Surgeon: Wendall Stade, MD;  [facility]: [facility] ENDOSCOPY;  Service: Cardiovascular;  Laterality: N/A;  . HERNIA REPAIR    . LAPAROSCOPIC GASTRIC BANDING  ~ 2009  . TEE WITHOUT CARDIOVERSION N/A 09/09/2017   Procedure: TRANSESOPHAGEAL ECHOCARDIOGRAM (TEE);  Surgeon: Wendall Stade, MD;  Location: [facility] ENDOSCOPY;  Service: Cardiovascular;  Laterality: N/A;  . TESTICLE REMOVAL Left ~ 2007  . UMBILICAL HERNIA REPAIR  ~ 2009     Medications: Current Meds  Medication Sig  . apixaban (ELIQUIS) 5 MG TABS tablet Take 1 tablet (5 mg total) by mouth 2 (two) times daily.  . carvedilol (COREG) 6.25 MG tablet Take 1 tablet (6.25 mg total) by mouth 2 (two) times daily.  . digoxin (LANOXIN) 0.125 MG tablet Take 1 tablet (0.125 mg total) by mouth daily.  . furosemide (LASIX) 40 MG tablet Take 1 tablet (40 mg total) by mouth daily as needed for fluid.  . ivabradine (CORLANOR) 5 MG TABS tablet Take 1 tablet (5 mg total) by mouth 2 (two) times daily with a meal.  . nortriptyline (PAMELOR) 25 MG capsule Take 75 mg by mouth at bedtime.   . sacubitril-valsartan (ENTRESTO) 24-26 MG Take 1 tablet by mouth 2 (two) times daily.  Marland Kitchen spironolactone (ALDACTONE) 25 MG tablet Take 0.5 tablets (12.5 mg total) by mouth daily.  Marland Kitchen zolpidem (AMBIEN)  10 MG tablet Take 10 mg by mouth at bedtime.   . [DISCONTINUED] furosemide (LASIX) 40 MG tablet Take 1 tablet (40 mg total) by mouth daily.     Allergies: Allergies  Allergen Reactions  . Keflex [Cephalexin] Other (See Comments)    Abdominal pain  . Sulfa Antibiotics Hives    Social History: The patient  reports that he quit smoking about 2 months ago. His smoking use included cigarettes. He has a 35.00 pack-year smoking history. He has never used smokeless tobacco. He reports that he does not drink alcohol or use drugs.   Family History: The patient's family history includes Healthy in his father.   Review of Systems: Please see the history of  present illness.   Otherwise, the review of systems is positive for none.   All other systems are reviewed and negative.   Physical Exam: VS:  BP 100/80 (BP Location: Left Arm, Patient Position: Sitting, Cuff Size: Normal)   Pulse 86   Ht 6' (1.829 m)   Wt 253 lb 6.4 oz (114.9 kg)   SpO2 96% Comment: at rest  BMI 34.37 kg/m  .  BMI Body mass index is 34.37 kg/m.  Wt Readings from Last 3 Encounters:  11/28/17 253 lb 6.4 oz (114.9 kg)  11/14/17 256 lb 3.2 oz (116.2 kg)  11/06/17 255 lb (115.7 kg)                General: Pleasant. Well developed, well nourished and in no acute distress.   HEENT: Normal.  Neck: Supple, no JVD, carotid bruits, or masses noted.  Cardiac: Regular rate and rhythm. No murmurs, rubs, or gallops. No edema.  Respiratory:  Lungs are clear to auscultation bilaterally with normal work of breathing.  GI: Soft and nontender.  MS: No deformity or atrophy. Gait and ROM intact.  Skin: Warm and dry. Color is normal.  Neuro:  Strength and sensation are intact and no gross focal deficits noted.  Psych: Alert, appropriate and with normal affect.   LABORATORY DATA:  EKG:  EKG is ordered today. This demonstrates SNR - HR down to 91 per EKG - down from 103.  Lab Results  Component Value Date   WBC 7.5 10/15/2017   HGB 17.0 10/15/2017   HCT 47.9 10/15/2017   PLT 162 10/15/2017   GLUCOSE 188 (H) 10/15/2017   CHOL 142 09/07/2017   TRIG 96 09/07/2017   HDL 27 (L) 09/07/2017   LDLCALC 96 09/07/2017   ALT 13 (L) 09/06/2017   AST 24 09/06/2017   NA 140 10/15/2017   K 4.2 10/15/2017   CL 100 10/15/2017   CREATININE 1.15 10/15/2017   BUN 19 10/15/2017   CO2 26 10/15/2017   TSH 2.089 09/06/2017   HGBA1C 6.4 (H) 09/06/2017     BNP (last 3 results) Recent Labs    09/06/17 1850  BNP 842.0*    ProBNP (last 3 results) Recent Labs    09/17/17 1232  PROBNP 471*     Other Studies Reviewed Today:  2D Doppler echocardiogram March 2019: Study  Conclusions  - Left ventricle: The cavity size was normal. Wall thickness was increased in a pattern of moderate LVH. Systolic function was mildly to moderately reduced. The estimated ejection fraction was 42%. Diffuse hypokinesis. Doppler parameters are consistent with abnormal left ventricular relaxation (grade 1 diastolic dysfunction). The E/e&' ratio is between 8-15, suggesting indeterminate LV filling pressure. Ejection fraction (MOD, 2-plane): 42%.  Impressions:  - Compared to a prior study  in 08/2017, the LVEF has improved from 20-25% up to 42%.   Assessment/Plan:  1. Elevated HR - has improved with the addition of Corlanor - samples given today - will try to get some assistance for him as well. I suspect his Pamelor is also playing an issue. He will continue to monitor at home. For now the visual disturbance he reports is tolerable.   2. Chronic systolic HF - EF of 10 to 15% by echo and 20% at time of TEE. He is on Entresto, diuretics, lanoxin, aldactone and low dose beta blocker. He has had some dizziness/orthostasis previously with trying to up titrate his medicines. Repeat echo showed improvement in his EF - he will have another limited echo in August with Dr. Katrinka BlazingSmith. Currently doing well - will change Lasix to just prn.   3. Atrial flutter - s/p cardioversion - and now s/p ablation - remains in sinus by exam and by EKG   4. Tobacco abuse - not smoking  5. Chronic anticoagulation - no problems noted.   Current medicines are reviewed with the patient today.  The patient does not have concerns regarding medicines other than what has been noted above.  The following changes have been made:  See above.  Labs/ tests ordered today include:    Orders Placed This Encounter  Procedures  . EKG 12-Lead     Disposition:   FU with Dr. Katrinka BlazingSmith as planned in August.    Patient is agreeable to this plan and will call if any problems develop in the interim.     SignedNorma Fredrickson: Tonie Vizcarrondo, NP  11/28/2017 12:30 PM  [facility] [address] [place], [place]  [id number] Phone: [phone] Fax: [phone]

## 2017-11-28 ENCOUNTER — Telehealth: Payer: Self-pay | Admitting: *Deleted

## 2017-11-28 ENCOUNTER — Encounter: Payer: Self-pay | Admitting: Nurse Practitioner

## 2017-11-28 ENCOUNTER — Ambulatory Visit (INDEPENDENT_AMBULATORY_CARE_PROVIDER_SITE_OTHER): Payer: BLUE CROSS/BLUE SHIELD | Admitting: Nurse Practitioner

## 2017-11-28 VITALS — BP 100/80 | HR 86 | Ht 72.0 in | Wt 253.4 lb

## 2017-11-28 DIAGNOSIS — I10 Essential (primary) hypertension: Secondary | ICD-10-CM

## 2017-11-28 DIAGNOSIS — I5042 Chronic combined systolic (congestive) and diastolic (congestive) heart failure: Secondary | ICD-10-CM

## 2017-11-28 DIAGNOSIS — Z8679 Personal history of other diseases of the circulatory system: Secondary | ICD-10-CM | POA: Diagnosis not present

## 2017-11-28 DIAGNOSIS — Z9889 Other specified postprocedural states: Secondary | ICD-10-CM

## 2017-11-28 DIAGNOSIS — I483 Typical atrial flutter: Secondary | ICD-10-CM

## 2017-11-28 MED ORDER — FUROSEMIDE 40 MG PO TABS: 40.0000 mg | ORAL_TABLET | Freq: Every day | ORAL | 11 refills | Status: DC | PRN

## 2017-11-28 NOTE — Patient Instructions (Addendum)
We will be checking the following labs today - NONE   Medication Instructions:    Continue with your current medicines. BUT  Ok to change the Lasix to "just as needed" - this means take for weight gain of 2 or more pounds overnight or a 5 pound gain in one week.     Testing/Procedures To Be Arranged:  N/A  Follow-Up:   See Dr. Katrinka BlazingSmith with echo as planned in August    Other Special Instructions:   N/A    If you need a refill on your cardiac medications before your next appointment, please call your pharmacy.   Call the [facility] office at [phone] if you have any questions, problems or concerns.

## 2017-11-28 NOTE — Telephone Encounter (Signed)
Prior authorization done for patients CORLANOR through covermymeds, [employer].

## 2017-12-02 NOTE — Telephone Encounter (Signed)
We will not use the medication.

## 2017-12-02 NOTE — Telephone Encounter (Signed)
**Note De-Identified Lachele Lievanos Obfuscation** The pt is advised. He states that he has a co-pay card for Corlanor and wants to know if he can continue to take Corlanor as long as he is getting a discount using that card. I have advised him that he can continue to take until he can no longer afford and that after that time to monitor his HR as he states that is the reason he is taking Corlanor and that if his HR becomes elevated to call us back. He verbalized understanding and thanked me for calling him.

## 2017-12-02 NOTE — Telephone Encounter (Addendum)
**Note De-Identified Diar Berkel Obfuscation** Corlanor PA denied per [place] from [employer].  Reason for denial: Corlanor is approved for pts with stable, chronic heart failure, who have a left ventricular ejection fraction less than or equal to 35%. In this case the member does not have a left ejection fraction less than or equal to 35%.  Will forward to Dr Katrinka BlazingSmith and his nurse for advisement.

## 2018-01-30 ENCOUNTER — Other Ambulatory Visit: Payer: Self-pay | Admitting: Nurse Practitioner

## 2018-03-02 ENCOUNTER — Other Ambulatory Visit: Payer: Self-pay | Admitting: Nurse Practitioner

## 2018-03-24 ENCOUNTER — Ambulatory Visit (HOSPITAL_COMMUNITY): Payer: BLUE CROSS/BLUE SHIELD | Attending: Cardiology

## 2018-03-24 ENCOUNTER — Other Ambulatory Visit: Payer: Self-pay

## 2018-03-24 DIAGNOSIS — I110 Hypertensive heart disease with heart failure: Secondary | ICD-10-CM | POA: Diagnosis not present

## 2018-03-24 DIAGNOSIS — I4892 Unspecified atrial flutter: Secondary | ICD-10-CM | POA: Insufficient documentation

## 2018-03-24 DIAGNOSIS — G4733 Obstructive sleep apnea (adult) (pediatric): Secondary | ICD-10-CM | POA: Diagnosis not present

## 2018-03-24 DIAGNOSIS — I5042 Chronic combined systolic (congestive) and diastolic (congestive) heart failure: Secondary | ICD-10-CM

## 2018-03-30 NOTE — Progress Notes (Signed)
Cardiology Office Note:    Date:  03/31/2018   ID:  Cory Jenkins., DOB June 13, 1966, MRN [id number]  PCP:  Patient, No Pcp Per  Cardiologist:  Cory Noe, MD   Referring MD: No ref. provider found   Chief Complaint  Patient presents with  . Congestive Heart Failure    History of Present Illness:    Cory Jenkins. is a 51 y.o. male with a hx of atrial flutter and cardiomyopathy. Echo shows severely reduced LV function; mild to moderate MR; moderate TR; moderately reduced RV function; small pericardial effusion.  Now status post a flutter ablation.  Moderate dose heart failure therapy has been started.  He is doing well.  Most recent echocardiogram remarkably shows that LV function is back to normal.  He closely monitors cardiac vital signs with an apple app that can record heart rate trend continuously.  He also monitor his blood pressure.  Heart rate tends to run in the 80s out of the doctor's office.  Blood pressures are usually less than 110/80 mmHg on current therapy.  He denies dyspnea, orthopnea, PND, lower extremity swelling, syncope, but continues to have orthostatic dizziness.  Past Medical History:  Diagnosis Date  . Atrial flutter (HCC) 09/06/2017  . Chronic combined systolic and diastolic heart failure (HCC) 09/06/2017  . Claustrophobia 07/27/2015  . Depression   . Diverticulitis of sigmoid colon 02/16/2015  . Dyspnea on exertion 09/06/2017  . Essential hypertension 10/03/2017  . Hx of laparoscopic gastric banding 02/16/2015  . Hypertension   . Insomnia with sleep apnea 06/14/2015  . Morbid obesity due to excess calories (HCC) 06/14/2015  . New onset atrial flutter (HCC) 09/06/2017  . Obesity hypoventilation syndrome (HCC) 06/14/2015  . OSA (obstructive sleep apnea)    "never ordered mask" (09/06/2017)  . Sleep related hypoventilation/hypoxemia in other disease 07/27/2015    Past Surgical History:  Procedure Laterality Date  . A-FLUTTER ABLATION N/A 10/07/2017    Procedure: A-FLUTTER ABLATION;  Surgeon: Marinus Maw, MD;  Location: [facility] INVASIVE CV LAB;  Service: Cardiovascular;  Laterality: N/A;  . CARDIOVERSION N/A 09/09/2017   Procedure: CARDIOVERSION;  Surgeon: Wendall Stade, MD;  Location: [facility] ENDOSCOPY;  Service: Cardiovascular;  Laterality: N/A;  . HERNIA REPAIR    . LAPAROSCOPIC GASTRIC BANDING  ~ 2009  . TEE WITHOUT CARDIOVERSION N/A 09/09/2017   Procedure: TRANSESOPHAGEAL ECHOCARDIOGRAM (TEE);  Surgeon: Wendall Stade, MD;  Location: [facility] ENDOSCOPY;  Service: Cardiovascular;  Laterality: N/A;  . TESTICLE REMOVAL Left ~ 2007  . UMBILICAL HERNIA REPAIR  ~ 2009    Current Medications: Current Meds  Medication Sig  . apixaban (ELIQUIS) 5 MG TABS tablet Take 1 tablet (5 mg total) by mouth 2 (two) times daily.  . carvedilol (COREG) 6.25 MG tablet Take 1 tablet (6.25 mg total) by mouth 2 (two) times daily.  . digoxin (LANOXIN) 0.125 MG tablet Take 1 tablet (0.125 mg total) by mouth daily.  . furosemide (LASIX) 40 MG tablet Take 1 tablet (40 mg total) by mouth daily as needed for fluid.  . ivabradine (CORLANOR) 5 MG TABS tablet Take 1 tablet (5 mg total) by mouth 2 (two) times daily with a meal.  . nortriptyline (PAMELOR) 25 MG capsule Take 75 mg by mouth at bedtime.   . sacubitril-valsartan (ENTRESTO) 24-26 MG Take 1 tablet by mouth 2 (two) times daily.  Marland Kitchen spironolactone (ALDACTONE) 25 MG tablet Take 0.5 tablets (12.5 mg total) by mouth daily.  Marland Kitchen zolpidem (  AMBIEN) 10 MG tablet Take 10 mg by mouth at bedtime.      Allergies:   Keflex [cephalexin] and Sulfa antibiotics   Social History   Socioeconomic History  . Marital status: Married    Spouse name: Not on file  . Number of children: Not on file  . Years of education: Not on file  . Highest education level: Not on file  Occupational History  . Not on file  Social Needs  . Financial resource strain: Not on file  . Food insecurity:    Worry: Not on file    Inability: Not on file    . Transportation needs:    Medical: Not on file    Non-medical: Not on file  Tobacco Use  . Smoking status: Former Smoker    Packs/day: 1.00    Years: 35.00    Pack years: 35.00    Types: Cigarettes    Last attempt to quit: 09/06/2017    Years since quitting: 0.5  . Smokeless tobacco: Never Used  Substance and Sexual Activity  . Alcohol use: No    Alcohol/week: 0.0 standard drinks  . Drug use: No  . Sexual activity: Yes  Lifestyle  . Physical activity:    Days per week: Not on file    Minutes per session: Not on file  . Stress: Not on file  Relationships  . Social connections:    Talks on phone: Not on file    Gets together: Not on file    Attends religious service: Not on file    Active member of club or organization: Not on file    Attends meetings of clubs or organizations: Not on file    Relationship status: Not on file  Other Topics Concern  . Not on file  Social History Narrative  . Not on file     Family History: The patient's family history includes Healthy in his father.  ROS:   Please see the history of present illness.    No complaints. All other systems reviewed and are negative.  EKGs/Labs/Other Studies Reviewed:    The following studies were reviewed today:   2D Doppler echocardiogram March 24, 2018: Study Conclusions   - Left ventricle: The cavity size was normal. Systolic function was   normal. The estimated ejection fraction was in the range of 55%   to 60%. Wall motion was normal; there were no regional wall   motion abnormalities. Doppler parameters are consistent with   abnormal left ventricular relaxation (grade 1 diastolic   dysfunction). - Aortic valve: There was no stenosis. - Mitral valve: There was no significant regurgitation. - Right ventricle: The cavity size was normal. Systolic function   was normal. - Pulmonary arteries: No complete TR doppler jet so unable to   estimate PA systolic pressure. - Inferior vena cava: The  vessel was normal in size. The   respirophasic diameter changes were in the normal range (>= 50%),   consistent with normal central venous pressure.   Impressions:   - Normal LV size with EF 55-60%. Normal RV size and systolic   function. No signifcant valvular abnormalities.   EKG:  EKG is not ordered today.   Recent Labs: 09/06/2017: ALT 13; B Natriuretic Peptide 842.0; Magnesium 1.6; TSH 2.089 09/17/2017: NT-Pro BNP 471 10/15/2017: BUN 19; Creatinine, Ser 1.15; Hemoglobin 17.0; Platelets 162; Potassium 4.2; Sodium 140  Recent Lipid Panel    Component Value Date/Time   CHOL 142 09/07/2017 0626   TRIG  96 09/07/2017 0626   HDL 27 (L) 09/07/2017 0626   CHOLHDL 5.3 09/07/2017 0626   VLDL 19 09/07/2017 0626   LDLCALC 96 09/07/2017 0626    Physical Exam:    VS:  BP 100/74   Pulse (!) 102   Ht 6' (1.829 m)   Wt 238 lb 12.8 oz (108.3 kg)   BMI 32.39 kg/m     Wt Readings from Last 3 Encounters:  03/31/18 238 lb 12.8 oz (108.3 kg)  11/28/17 253 lb 6.4 oz (114.9 kg)  11/14/17 256 lb 3.2 oz (116.2 kg)     GEN:  Well nourished, well developed in no acute distress HEENT: Normal NECK: No JVD. LYMPHATICS: No lymphadenopathy CARDIAC: RRR, no murmur, no gallop, no  edema. VASCULAR: 2+ bilateral radial pulses. no bruits. RESPIRATORY:  Clear to auscultation without rales, wheezing or rhonchi  ABDOMEN: Soft, non-tender, non-distended, No pulsatile mass, MUSCULOSKELETAL: No deformity  SKIN: Warm and dry NEUROLOGIC:  Alert and oriented x 3 PSYCHIATRIC:  Normal affect   ASSESSMENT:    1. Typical atrial flutter (HCC)   2. Chronic combined systolic and diastolic heart failure (HCC)   3. Essential hypertension   4. OSA (obstructive sleep apnea)   5. Morbid obesity due to excess calories (HCC)   6. Tobacco use disorder    PLAN:    In order of problems listed above:  1. Status post ablation.  At home heart rates have been running between 75 and 100 bpm on his digital heart rate  app. 2. Recent echo demonstrated normal LV size and function.  EF is now 60%.  Plan to adjust medical regimen accordingly: Continue carvedilol 6.25 mg p.o. twice daily, spironolactone 12.5 mg/day, aspirin 81 mg daily and start losartan 50 mg/day.  Discontinue Entresto, Corlanor, Lanoxin, Eliquis, and Lasix.  Basic metabolic panel in 2 to 4 weeks.  Continue to monitor heart rate trends and weigh daily.  Call if significant weight increase. 3. Continue therapy. 4. Continue weight loss as he is doing.  Clinical follow-up in 6 months.  Earlier if increased weight, sustained increase in heart rate, shortness of breath, or other complaints.   Medication Adjustments/Labs and Tests Ordered: Current medicines are reviewed at length with the patient today.  Concerns regarding medicines are outlined above.  No orders of the defined types were placed in this encounter.  No orders of the defined types were placed in this encounter.   There are no Patient Instructions on file for this visit.   Signed, Cory NoeHenry W Aleah Ahlgrim III, MD  03/31/2018 1:48 PM    [facility]

## 2018-03-31 ENCOUNTER — Ambulatory Visit (INDEPENDENT_AMBULATORY_CARE_PROVIDER_SITE_OTHER): Payer: BLUE CROSS/BLUE SHIELD | Admitting: Interventional Cardiology

## 2018-03-31 ENCOUNTER — Encounter: Payer: Self-pay | Admitting: Interventional Cardiology

## 2018-03-31 VITALS — BP 100/74 | HR 102 | Ht 72.0 in | Wt 238.8 lb

## 2018-03-31 DIAGNOSIS — I5042 Chronic combined systolic (congestive) and diastolic (congestive) heart failure: Secondary | ICD-10-CM | POA: Diagnosis not present

## 2018-03-31 DIAGNOSIS — I483 Typical atrial flutter: Secondary | ICD-10-CM

## 2018-03-31 DIAGNOSIS — G4733 Obstructive sleep apnea (adult) (pediatric): Secondary | ICD-10-CM

## 2018-03-31 DIAGNOSIS — I10 Essential (primary) hypertension: Secondary | ICD-10-CM

## 2018-03-31 DIAGNOSIS — F17200 Nicotine dependence, unspecified, uncomplicated: Secondary | ICD-10-CM

## 2018-03-31 MED ORDER — SPIRONOLACTONE 25 MG PO TABS: 12.5000 mg | ORAL_TABLET | Freq: Every day | ORAL | 11 refills | Status: DC

## 2018-03-31 MED ORDER — ASPIRIN EC 81 MG PO TBEC: 81.0000 mg | DELAYED_RELEASE_TABLET | Freq: Every day | ORAL | Status: AC

## 2018-03-31 MED ORDER — LOSARTAN POTASSIUM 50 MG PO TABS: 50.0000 mg | ORAL_TABLET | Freq: Every day | ORAL | 3 refills | Status: DC

## 2018-03-31 NOTE — Patient Instructions (Signed)
Medication Instructions:  1. YOU WILL CONTINUE YOUR CURRENT MEDICATIONS AS YOU ARE RIGHT NOW FOR THE NEXT 1 WEEK; THEN YOU FOLLOW THE MEDICATION LIST YOU HAVE BEEN GIVEN TODAY WITH THESE FOLLOWING CHANGES:  STOP ELIQUIS STOP LASIX STOP DIGOXIN STOP ENTRESTO  START ASPIRIN 81 MG  START LOSARTAN 50 MG DAILY CONTINUE ON COREG, SPIRONOLACTONE 12.5 MG DAILY AND OTHER MEDICATIONS ON TODAY'S MED LIST   A REFILL HAS BEEN SENT IN FOR SPIRONOLACTONE     Labwork: BMET TO BE DONE IN 2-4 WEEKS AFTER MED CHANGES HAVE BEEN MADE  Testing/Procedures: NONE ORDERED TODAY  Follow-Up: 6 MONTHS WITH DR. Katrinka BlazingSMITH   Any Other Special Instructions Will Be Listed Below (If Applicable).  MONITOR BLOOD PRESSURE AND CALL THE OFFICE IF BP IS RUNNING 140/90 OR HIGHER   CALL IF HEART RATE IS RUNNING 110 OR HIGHER   If you need a refill on your cardiac medications before your next appointment, please call your pharmacy.

## 2018-04-15 ENCOUNTER — Telehealth: Payer: Self-pay | Admitting: Interventional Cardiology

## 2018-04-15 NOTE — Telephone Encounter (Signed)
Pt started Losartan 50mg  QD on 04/07/18.  States the following day he was very fatigued.  Over the last week has developed burning upon urination but this does not occur everytime he goes.  Also is thirsty all the time.  BP today was 110/80, HR 80.  Pt states this is the highest his BP has been.  Pt did confess to not taking Losartan yesterday or today but symptoms have not improved yet.  Advised U would send message to Dr. Katrinka Blazing for review and advisement.

## 2018-04-15 NOTE — Telephone Encounter (Signed)
New message  Pt c/o medication issue:  1. Name of Medication: losartan (COZAAR) 50 MG tablet  2. How are you currently taking this medication (dosage and times per day)? Once a day in the morning  3. Are you having a reaction (difficulty breathing--STAT)? no  4. What is your medication issue? No appetite, no energy, hard to swallow, fatigue

## 2018-04-16 NOTE — Telephone Encounter (Signed)
Follow up    Patient is following up on the recommendations of Dr. Katrinka Blazing concerning his medication and BP. Please call.    Pt c/o medication issue:  1. Name of Medication: losartan (COZAAR) 50 MG tablet  2. How are you currently taking this medication (dosage and times per day)? Once a day in the morning  3. Are you having a reaction (difficulty breathing--STAT)? no  4. What is your medication issue? No appetite, no energy, hard to swallow, fatigue

## 2018-04-16 NOTE — Telephone Encounter (Signed)
Spoke to patient who has not taken Losartan since Sunday 9/1.  His symptoms have continued and he just feels "nasty".  BP 100/60.    He does not have a PCP at the present time, the MD has retired.  I told him to continue monitoring his BP and if symptoms are not improving he may want to reach out to Urgent Care. We will reach out tomorrow to follow up on his symptoms and BP.

## 2018-04-16 NOTE — Telephone Encounter (Signed)
These are not common symptoms of losartan. If they have continued after stopping losartan could be something else going on. Would recommend he follow up with primary care/urgernt care.   If blood pressure is low on losartan could cut dose to 25mg  daily. Continue to monitor pressures and call with additional issues/low readings.

## 2018-04-16 NOTE — Telephone Encounter (Signed)
Please review patient message.  Patient was started on Losartan 8/26 and having symptoms. BP and HR are stable.  Dr Katrinka Blazing is on vacation, please advise.  Thank you.

## 2018-04-17 NOTE — Telephone Encounter (Signed)
Get CBC and CMET. He needs to see PCP.

## 2018-04-17 NOTE — Telephone Encounter (Signed)
Pt was actually able to get established with Dr. Nadyne Coombes this morning.  Blood sugar was 414.  Pt states Dr. Hal Hope did a lot of labs and he is going back tomorrow for results.  Advised pt to have their office send [place] a copy of his labs.  Pt in agreement and appreciative for call.

## 2018-04-22 NOTE — Telephone Encounter (Signed)
Thanks

## 2018-05-05 ENCOUNTER — Other Ambulatory Visit: Payer: BLUE CROSS/BLUE SHIELD

## 2018-05-30 IMAGING — DX DG CHEST 2V
2 series · 2 of 2 positions shown · non-contrast
Comparison: 01/06/2016

CLINICAL DATA: Tachycardia pneumonia

EXAM:
CHEST  2 VIEW

[chest pa]
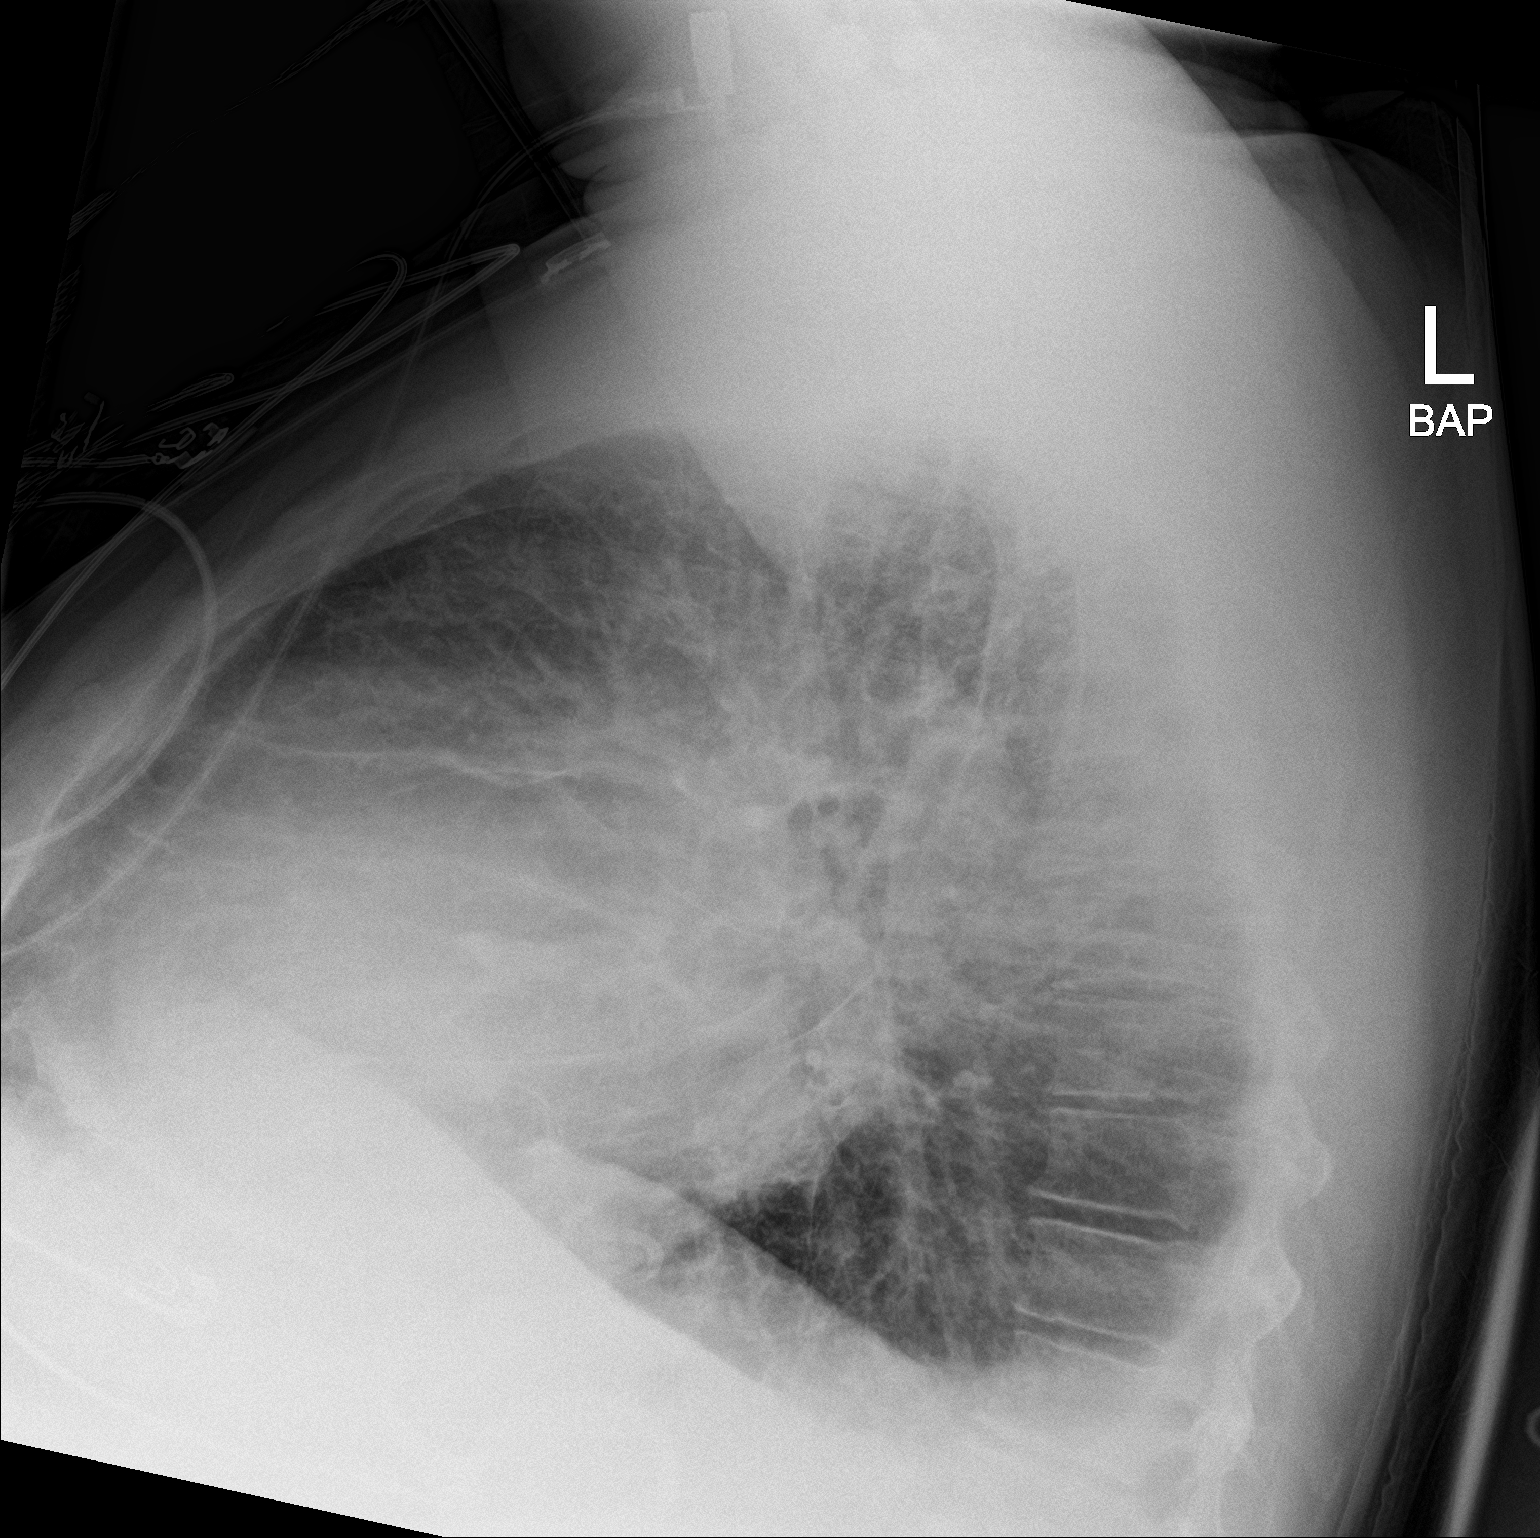

[chest ap]
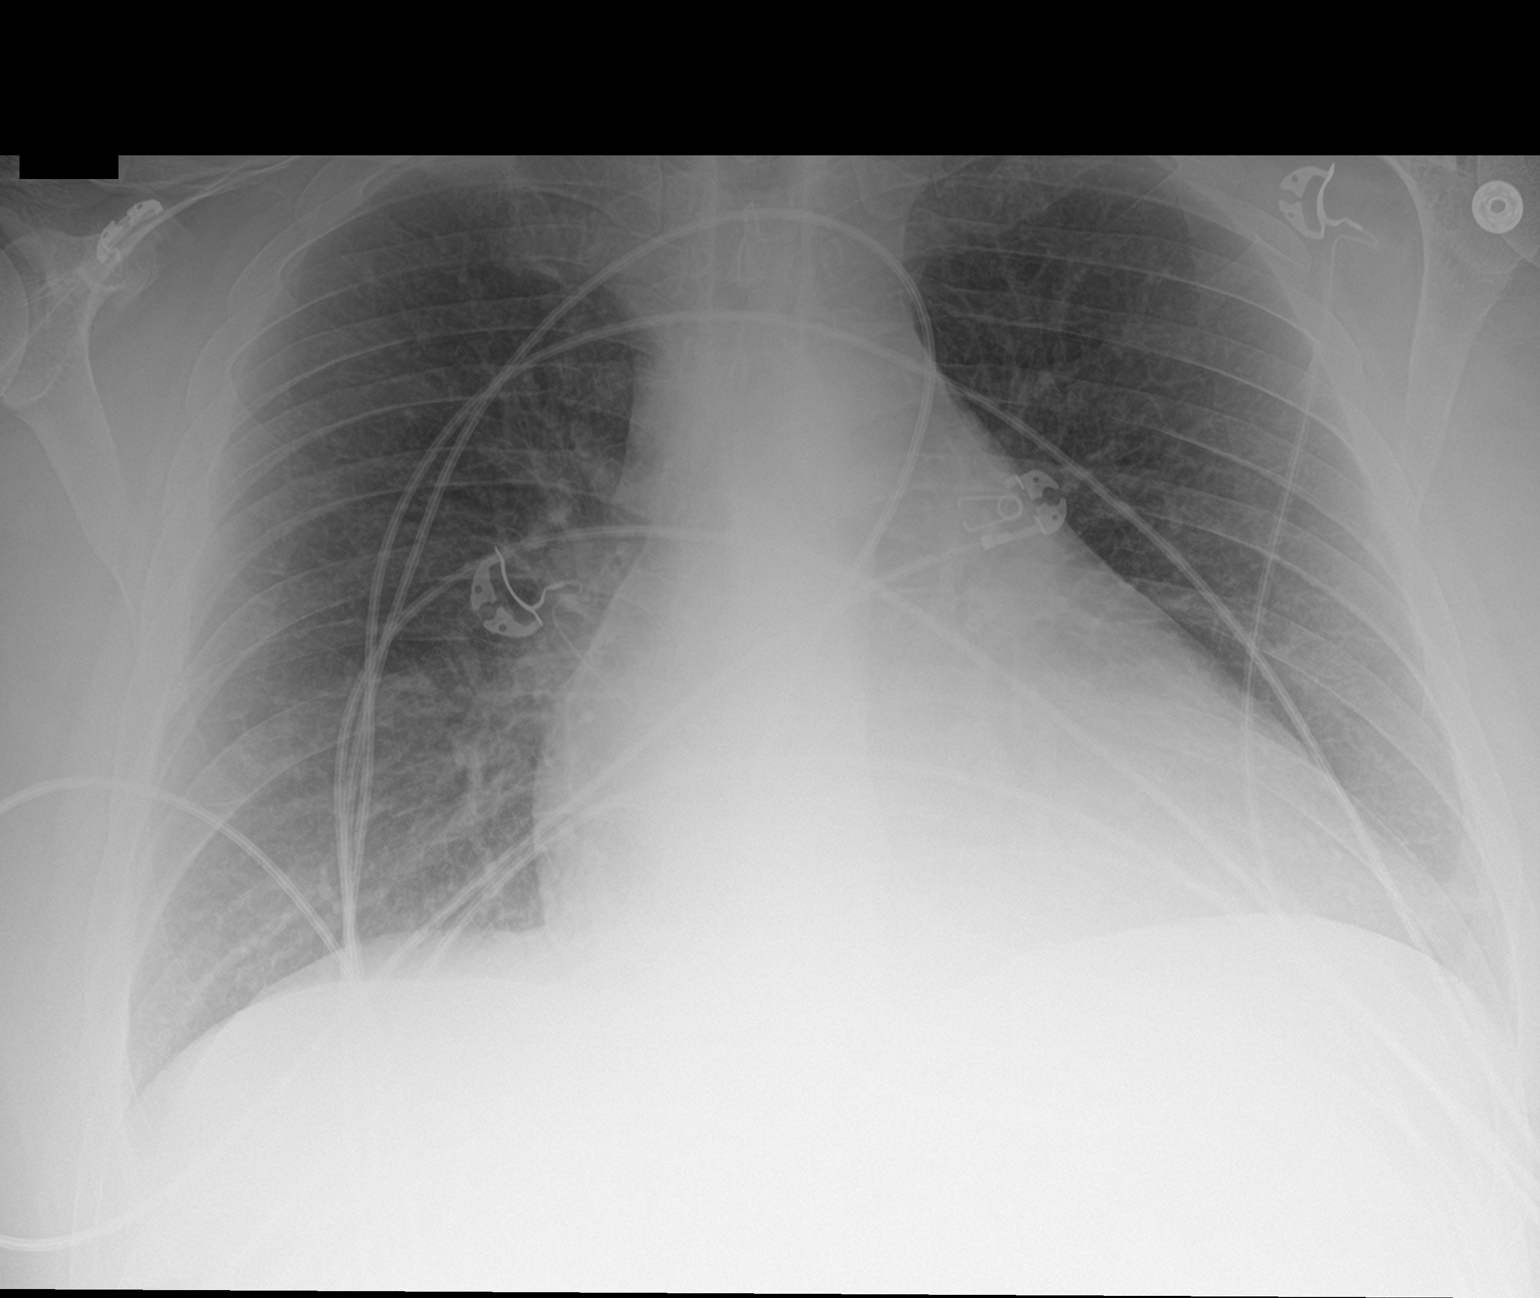

[2 of 2 positions shown; findings below may reference images not displayed]

FINDINGS: Stable cardiomegaly. No aortic aneurysm. No pneumonic
consolidations. On the lateral view there is mild increased
interstitial edema and mild vascular congestion. Minimal blunting of
the posterior costophrenic angles may be related to trace effusions
versus pleural thickening. No acute osseous abnormality.
IMPRESSION: On the lateral view there appears to be mild interstitial edema and
vascular congestion with small posterior pleural effusions. No
alveolar consolidations. Stable cardiomegaly.

## 2018-08-13 ENCOUNTER — Other Ambulatory Visit: Payer: Self-pay | Admitting: Nurse Practitioner

## 2018-08-21 ENCOUNTER — Other Ambulatory Visit: Payer: Self-pay | Admitting: Interventional Cardiology

## 2018-08-21 MED ORDER — CARVEDILOL 6.25 MG PO TABS: 6.2500 mg | ORAL_TABLET | Freq: Two times a day (BID) | ORAL | 2 refills | Status: DC

## 2018-08-28 ENCOUNTER — Other Ambulatory Visit (HOSPITAL_COMMUNITY): Payer: Self-pay | Admitting: Physician Assistant

## 2018-12-02 ENCOUNTER — Ambulatory Visit: Payer: BLUE CROSS/BLUE SHIELD | Admitting: Interventional Cardiology

## 2018-12-02 ENCOUNTER — Ambulatory Visit: Payer: BLUE CROSS/BLUE SHIELD | Admitting: Internal Medicine

## 2018-12-18 ENCOUNTER — Other Ambulatory Visit: Payer: Self-pay | Admitting: Interventional Cardiology

## 2018-12-18 MED ORDER — LOSARTAN POTASSIUM 50 MG PO TABS: 50.0000 mg | ORAL_TABLET | Freq: Every day | ORAL | 0 refills | Status: DC

## 2018-12-22 ENCOUNTER — Other Ambulatory Visit: Payer: Self-pay | Admitting: Interventional Cardiology

## 2018-12-22 NOTE — Telephone Encounter (Signed)
Should probably be 20 mg daily of Olmesartan. I think 40 mg is the max dose. Please verify.

## 2018-12-23 MED ORDER — OLMESARTAN MEDOXOMIL 20 MG PO TABS: 20.0000 mg | ORAL_TABLET | Freq: Every day | ORAL | 3 refills | Status: DC

## 2018-12-23 NOTE — Telephone Encounter (Signed)
Olmesartan 20mg  would be the equivalent. Thanks!

## 2018-12-23 NOTE — Telephone Encounter (Signed)
Pt was on Losartan 50mg  QD.  Now on backorder until the end of the month.  Would Olmesartan 20 or 40 be the equivalent?

## 2018-12-26 NOTE — Telephone Encounter (Signed)
Please review

## 2019-02-25 ENCOUNTER — Other Ambulatory Visit: Payer: Self-pay | Admitting: Interventional Cardiology

## 2019-03-18 ENCOUNTER — Other Ambulatory Visit: Payer: Self-pay | Admitting: Interventional Cardiology

## 2019-03-18 ENCOUNTER — Ambulatory Visit (INDEPENDENT_AMBULATORY_CARE_PROVIDER_SITE_OTHER): Payer: BLUE CROSS/BLUE SHIELD | Admitting: Internal Medicine

## 2019-03-18 ENCOUNTER — Encounter: Payer: Self-pay | Admitting: Internal Medicine

## 2019-03-18 ENCOUNTER — Other Ambulatory Visit: Payer: Self-pay

## 2019-03-18 VITALS — BP 122/80 | HR 85 | Ht 72.0 in | Wt 286.0 lb

## 2019-03-18 DIAGNOSIS — I5042 Chronic combined systolic (congestive) and diastolic (congestive) heart failure: Secondary | ICD-10-CM | POA: Diagnosis not present

## 2019-03-18 DIAGNOSIS — I483 Typical atrial flutter: Secondary | ICD-10-CM | POA: Diagnosis not present

## 2019-03-18 NOTE — Progress Notes (Signed)
HPI Mr. Cory Jenkins returns today for followup of atrial flutter, s/p ablation. He had a tachy induced CM and his EF has normalized. His DM had been poorly controlled but is better with diet. He was losing weight until Covid. He denies chest pain or sob. No edema. No palpitations. Allergies  Allergen Reactions  . Keflex [Cephalexin] Other (See Comments)    Abdominal pain  . Sulfa Antibiotics Hives     Current Outpatient Medications  Medication Sig Dispense Refill  . aspirin EC 81 MG tablet Take 1 tablet (81 mg total) by mouth daily.    . carvedilol (COREG) 6.25 MG tablet Take 1 tablet (6.25 mg total) by mouth 2 (two) times daily. 180 tablet 2  . metFORMIN (GLUCOPHAGE) 1000 MG tablet Take 1,000 mg by mouth 2 (two) times daily with a meal.    . nortriptyline (PAMELOR) 25 MG capsule Take 75 mg by mouth at bedtime.     Marland Kitchen. olmesartan (BENICAR) 20 MG tablet Take 1 tablet (20 mg total) by mouth daily. 30 tablet 3  . spironolactone (ALDACTONE) 25 MG tablet TAKE 1/2 TABLET BY MOUTH EVERY DAY 45 tablet 0  . zolpidem (AMBIEN) 10 MG tablet Take 10 mg by mouth at bedtime.      No current facility-administered medications for this visit.      Past Medical History:  Diagnosis Date  . Atrial flutter (HCC) 09/06/2017  . Chronic combined systolic and diastolic heart failure (HCC) 09/06/2017  . Claustrophobia 07/27/2015  . Depression   . Diverticulitis of sigmoid colon 02/16/2015  . Dyspnea on exertion 09/06/2017  . Essential hypertension 10/03/2017  . Hx of laparoscopic gastric banding 02/16/2015  . Hypertension   . Insomnia with sleep apnea 06/14/2015  . Morbid obesity due to excess calories (HCC) 06/14/2015  . New onset atrial flutter (HCC) 09/06/2017  . Obesity hypoventilation syndrome (HCC) 06/14/2015  . OSA (obstructive sleep apnea)    "never ordered mask" (09/06/2017)  . Sleep related hypoventilation/hypoxemia in other disease 07/27/2015    ROS:   All systems reviewed and negative except as  noted in the HPI.   Past Surgical History:  Procedure Laterality Date  . A-FLUTTER ABLATION N/A 10/07/2017   Procedure: A-FLUTTER ABLATION;  Surgeon: Marinus Mawaylor, Bright Spielmann W, MD;  [facility];  Service: Cardiovascular;  Laterality: N/A;  . CARDIOVERSION N/A 09/09/2017   Procedure: CARDIOVERSION;  Surgeon: Wendall StadeNishan, Peter C, MD;  [facility];  Service: Cardiovascular;  Laterality: N/A;  . HERNIA REPAIR    . LAPAROSCOPIC GASTRIC BANDING  ~ 2009  . TEE WITHOUT CARDIOVERSION N/A 09/09/2017   Procedure: TRANSESOPHAGEAL ECHOCARDIOGRAM (TEE);  Surgeon: Wendall StadeNishan, Peter C, MD;  [facility];  Service: Cardiovascular;  Laterality: N/A;  . TESTICLE REMOVAL Left ~ 2007  . UMBILICAL HERNIA REPAIR  ~ 2009     Family History  Problem Relation Age of Onset  . Healthy Father      Social History   Socioeconomic History  . Marital status: Married    Spouse name: Not on file  . Number of children: Not on file  . Years of education: Not on file  . Highest education level: Not on file  Occupational History  . Not on file  Social Needs  . Financial resource strain: Not on file  . Food insecurity    Worry: Not on file    Inability: Not on file  . Transportation needs    Medical: Not on file  Non-medical: Not on file  Tobacco Use  . Smoking status: Former Smoker    Packs/day: 1.00    Years: 35.00    Pack years: 35.00    Types: Cigarettes    Quit date: 09/06/2017    Years since quitting: 1.5  . Smokeless tobacco: Never Used  Substance and Sexual Activity  . Alcohol use: No    Alcohol/week: 0.0 standard drinks  . Drug use: No  . Sexual activity: Yes  Lifestyle  . Physical activity    Days per week: Not on file    Minutes per session: Not on file  . Stress: Not on file  Relationships  . Social Herbalist on phone: Not on file    Gets together: Not on file    Attends religious service: Not on file    Active member of club or organization: Not on  file    Attends meetings of clubs or organizations: Not on file    Relationship status: Not on file  . Intimate partner violence    Fear of current or ex partner: Not on file    Emotionally abused: Not on file    Physically abused: Not on file    Forced sexual activity: Not on file  Other Topics Concern  . Not on file  Social History Narrative  . Not on file     BP 122/80   Pulse 85   Ht 6' (1.829 m)   Wt 286 lb (129.7 kg)   SpO2 99%   BMI 38.79 kg/m   Physical Exam:  Well appearing NAD HEENT: Unremarkable Neck:  No JVD, no thyromegally Lymphatics:  No adenopathy Back:  No CVA tenderness Lungs:  Clear HEART:  Regular rate rhythm, no murmurs, no rubs, no clicks Abd:  soft, positive bowel sounds, no organomegally, no rebound, no guarding Ext:  2 plus pulses, no edema, no cyanosis, no clubbing Skin:  No rashes no nodules Neuro:  CN II through XII intact, motor grossly intact  EKG - NSR   Assess/Plan: 1. Atrial flutter - he is s/p ablation. He will undergo watchful waiting. 2. Tachy induced CM - he has had a return to normal LV function. He will continue his current meds. I will defer any decisions on stopping his CHF meds to Dr. Tamala Julian. 3. Obesity - I encouraged the patient to lose weight.   Mikle Bosworth.D.

## 2019-03-18 NOTE — Patient Instructions (Signed)
Medication Instructions:  Your physician recommends that you continue on your current medications as directed. Please refer to the Current Medication list given to you today.  Labwork: None ordered.  Testing/Procedures: None ordered.  Follow-Up: Your physician wants you to follow-up in: as needed with Dr. Taylor.      Any Other Special Instructions Will Be Listed Below (If Applicable).  If you need a refill on your cardiac medications before your next appointment, please call your pharmacy.   

## 2019-03-25 NOTE — Progress Notes (Signed)
Cardiology Office Note:    Date:  03/27/2019   ID:  Cory MarketAlfred B Treiber Jr., DOB October 30, 1965, MRN [id number]  PCP:  Cory Davenportichter, Karen L, MD  Cardiologist:  Cory NoeHenry W Uday Jantz III, MD   Referring MD: Cory Davenportichter, Karen L, MD   Chief Complaint  Patient presents with  . Congestive Heart Failure  . Atrial Fibrillation    History of Present Illness:    Cory Marketlfred B Janssens Jr. is a 53 y.o. male with a hx of typical atrial flutter, tachycardia related  Non-ischemic ardiomyopathy, EF 25%-->55% 03/2018 on med therapy after A. flutter ablation, obesity, and chronic anticoagulation therapy.  Tad is doing well.  He has no cardiac complaints..  He denies orthopnea and PND.  He is noncompliant with CPAP.  No chest pain, lower extremity swelling, orthopnea, edema, palpitations, or angina.  Past Medical History:  Diagnosis Date  . Atrial flutter (HCC) 09/06/2017  . Chronic combined systolic and diastolic heart failure (HCC) 09/06/2017  . Claustrophobia 07/27/2015  . Depression   . Diverticulitis of sigmoid colon 02/16/2015  . Dyspnea on exertion 09/06/2017  . Essential hypertension 10/03/2017  . Hx of laparoscopic gastric banding 02/16/2015  . Hypertension   . Insomnia with sleep apnea 06/14/2015  . Morbid obesity due to excess calories (HCC) 06/14/2015  . New onset atrial flutter (HCC) 09/06/2017  . Obesity hypoventilation syndrome (HCC) 06/14/2015  . OSA (obstructive sleep apnea)    "never ordered mask" (09/06/2017)  . Sleep related hypoventilation/hypoxemia in other disease 07/27/2015    Past Surgical History:  Procedure Laterality Date  . A-FLUTTER ABLATION N/A 10/07/2017   Procedure: A-FLUTTER ABLATION;  Surgeon: Marinus Jenkins, Cory W, MD;  [facility];  Service: Cardiovascular;  Laterality: N/A;  . CARDIOVERSION N/A 09/09/2017   Procedure: CARDIOVERSION;  Surgeon: Wendall StadeNishan, Cory C, MD;  [facility];  Service: Cardiovascular;  Laterality: N/A;  . HERNIA REPAIR    . LAPAROSCOPIC GASTRIC BANDING   ~ 2009  . TEE WITHOUT CARDIOVERSION N/A 09/09/2017   Procedure: TRANSESOPHAGEAL ECHOCARDIOGRAM (TEE);  Surgeon: Wendall StadeNishan, Cory C, MD;  [facility];  Service: Cardiovascular;  Laterality: N/A;  . TESTICLE REMOVAL Left ~ 2007  . UMBILICAL HERNIA REPAIR  ~ 2009    Current Medications: Current Meds  Medication Sig  . aspirin EC 81 MG tablet Take 1 tablet (81 mg total) by mouth daily.  . carvedilol (COREG) 6.25 MG tablet Take 1 tablet (6.25 mg total) by mouth 2 (two) times daily.  . metFORMIN (GLUCOPHAGE) 1000 MG tablet Take 1,000 mg by mouth 2 (two) times daily with a meal.  . nortriptyline (PAMELOR) 25 MG capsule Take 75 mg by mouth at bedtime.   Marland Kitchen. olmesartan (BENICAR) 20 MG tablet TAKE 1 TABLET BY MOUTH EVERY DAY  . zolpidem (AMBIEN) 10 MG tablet Take 10 mg by mouth at bedtime.   . [DISCONTINUED] spironolactone (ALDACTONE) 25 MG tablet TAKE 1/2 TABLET BY MOUTH EVERY DAY     Allergies:   Keflex [cephalexin] and Sulfa antibiotics   Social History   Socioeconomic History  . Marital status: Married    Spouse name: Not on file  . Number of children: Not on file  . Years of education: Not on file  . Highest education level: Not on file  Occupational History  . Not on file  Social Needs  . Financial resource strain: Not on file  . Food insecurity    Worry: Not on file    Inability: Not on file  .  Transportation needs    Medical: Not on file    Non-medical: Not on file  Tobacco Use  . Smoking status: Former Smoker    Packs/day: 1.00    Years: 35.00    Pack years: 35.00    Types: Cigarettes    Quit date: 09/06/2017    Years since quitting: 1.5  . Smokeless tobacco: Never Used  Substance and Sexual Activity  . Alcohol use: No    Alcohol/week: 0.0 standard drinks  . Drug use: No  . Sexual activity: Yes  Lifestyle  . Physical activity    Days per week: Not on file    Minutes per session: Not on file  . Stress: Not on file  Relationships  . Social Wellsite geologistconnections     Talks on phone: Not on file    Gets together: Not on file    Attends religious service: Not on file    Active member of club or organization: Not on file    Attends meetings of clubs or organizations: Not on file    Relationship status: Not on file  Other Topics Concern  . Not on file  Social History Narrative  . Not on file     Family History: The patient's family history includes Healthy in his father.  ROS:   Please see the history of present illness.    Sleeping well.  Appetite is good.  Not exercising.  All other systems reviewed and are negative.  EKGs/Labs/Other Studies Reviewed:    The following studies were reviewed today: No new data.    Labs from Dr. Hal Hopeichter have been reviewed.  Lipids, kidney function, and potassium are all intact.  2D Doppler echocardiogram August 2019: Study Conclusions  - Left ventricle: The cavity size was normal. Systolic function was   normal. The estimated ejection fraction was in the range of 55%   to 60%. Wall motion was normal; there were no regional wall   motion abnormalities. Doppler parameters are consistent with   abnormal left ventricular relaxation (grade 1 diastolic   dysfunction). - Aortic valve: There was no stenosis. - Mitral valve: There was no significant regurgitation. - Right ventricle: The cavity size was normal. Systolic function   was normal. - Pulmonary arteries: No complete TR doppler jet so unable to   estimate PA systolic pressure. - Inferior vena cava: The vessel was normal in size. The   respirophasic diameter changes were in the normal range (>= 50%),   consistent with normal central venous pressure.  Impressions:  - Normal LV size with EF 55-60%. Normal RV size and systolic   function. No signifcant valvular abnormalities.  EKG:  EKG no new tracing is obtained today.  The ECG of August 5 performed by Dr. Ladona Ridgelaylor demonstrates vertical axis but is otherwise normal.  Recent Labs: No results found for  requested labs within last 8760 hours.  Recent Lipid Panel    Component Value Date/Time   CHOL 142 09/07/2017 0626   TRIG 96 09/07/2017 0626   HDL 27 (Jenkins) 09/07/2017 0626   CHOLHDL 5.3 09/07/2017 0626   VLDL 19 09/07/2017 0626   LDLCALC 96 09/07/2017 0626    Physical Exam:    VS:  BP 106/72   Pulse 86   Ht 6' (1.829 m)   Wt 240 lb 3.2 oz (109 kg)   SpO2 96%   BMI 32.58 kg/m     Wt Readings from Last 3 Encounters:  03/27/19 240 lb 3.2 oz (109 kg)  03/18/19  286 lb (129.7 kg)  03/31/18 238 lb 12.8 oz (108.3 kg)     GEN: Obese. No acute distress HEENT: Normal NECK: No JVD. LYMPHATICS: No lymphadenopathy CARDIAC:  RRR without murmur, gallop, or edema. VASCULAR: Normal Pulses. No bruits. RESPIRATORY:  Clear to auscultation without rales, wheezing or rhonchi  ABDOMEN: Soft, non-tender, non-distended, No pulsatile mass, MUSCULOSKELETAL: No deformity  SKIN: Warm and dry NEUROLOGIC:  Alert and oriented x 3 PSYCHIATRIC:  Normal affect   ASSESSMENT:    1. Chronic combined systolic and diastolic heart failure ([place])   2. Typical atrial flutter ([place])   3. Essential hypertension   4. OSA (obstructive sleep apnea)   5. S/P ablation of atrial flutter   6. Educated About Covid-19 Virus Infection    PLAN:    In order of problems listed above:  1. Heart failure has resolved.  LV function was normal a year ago.  He is asymptomatic.  Heart failure was likely related to tachycardia.  Continue beta-blocker and ACE inhibitor therapy. 2. EKG from 1 week ago demonstrates normal sinus rhythm.  He does have vertical axis which is somewhat concerning because it may reflect right heart strain.  In view of sleep apnea that is not being treated, there is concerned about the possibility of developing right heart issues.  Will follow. 3. Blood pressure is better than good.  Discontinue Spironolactone. 4. He will not use sleep apnea positive pressure treatment because he sleeps on his abdomen.  He  understands the consequences.  May be a candidate for other therapy for obstructive upper airway.  This should be considered by his primary care physician. 5. No recurrence of atrial flutter.  Overall doing quite well.  Encouraged 150 minutes of moderate activity.  Aldactone is being discontinued.  Monitor blood pressure and notify if he begins having elevation above 130/80 mmHg.  Recent labs looked excellent.   Medication Adjustments/Labs and Tests Ordered: Current medicines are reviewed at length with the patient today.  Concerns regarding medicines are outlined above.  No orders of the defined types were placed in this encounter.  No orders of the defined types were placed in this encounter.   Patient Instructions  Medication Instructions:  1) DISCONTINUE Spironolactone  If you need a refill on your cardiac medications before your next appointment, please call your pharmacy.   Lab work: None If you have labs (blood work) drawn today and your tests are completely normal, you will receive your results only by: Marland Kitchen MyChart Message (if you have MyChart) OR . A paper copy in the mail If you have any lab test that is abnormal or we need to change your treatment, we will call you to review the results.  Testing/Procedures: None  Follow-Up: At [facility], you and your health needs are our priority.  As part of our continuing mission to provide you with exceptional heart care, we have created designated Provider Care Teams.  These Care Teams include your primary Cardiologist (physician) and Advanced Practice Providers (APPs -  Physician Assistants and Nurse Practitioners) who all work together to provide you with the care you need, when you need it. You will need a follow up appointment in 12 months.  Please call our office 2 months in advance to schedule this appointment.  You may see Sinclair Grooms, MD or one of the following Advanced Practice Providers on your designated Care Team:    Truitt Merle, NP Cecilie Kicks, NP . Kathyrn Drown, NP  Any Other  Special Instructions Will Be Listed Below (If Applicable).       Signed, Cory NoeHenry W Caston Coopersmith III, MD  03/27/2019 8:26 AM    [place] [facility]

## 2019-03-27 ENCOUNTER — Encounter: Payer: Self-pay | Admitting: Interventional Cardiology

## 2019-03-27 ENCOUNTER — Other Ambulatory Visit: Payer: Self-pay

## 2019-03-27 ENCOUNTER — Ambulatory Visit (INDEPENDENT_AMBULATORY_CARE_PROVIDER_SITE_OTHER): Payer: BLUE CROSS/BLUE SHIELD | Admitting: Interventional Cardiology

## 2019-03-27 VITALS — BP 106/72 | HR 86 | Ht 72.0 in | Wt 240.2 lb

## 2019-03-27 DIAGNOSIS — I5042 Chronic combined systolic (congestive) and diastolic (congestive) heart failure: Secondary | ICD-10-CM | POA: Diagnosis not present

## 2019-03-27 DIAGNOSIS — I483 Typical atrial flutter: Secondary | ICD-10-CM

## 2019-03-27 DIAGNOSIS — Z9889 Other specified postprocedural states: Secondary | ICD-10-CM

## 2019-03-27 DIAGNOSIS — I10 Essential (primary) hypertension: Secondary | ICD-10-CM | POA: Diagnosis not present

## 2019-03-27 DIAGNOSIS — G4733 Obstructive sleep apnea (adult) (pediatric): Secondary | ICD-10-CM

## 2019-03-27 DIAGNOSIS — Z7189 Other specified counseling: Secondary | ICD-10-CM

## 2019-03-27 DIAGNOSIS — Z8679 Personal history of other diseases of the circulatory system: Secondary | ICD-10-CM

## 2019-03-27 NOTE — Patient Instructions (Addendum)
Medication Instructions:  1) DISCONTINUE Spironolactone  If you need a refill on your cardiac medications before your next appointment, please call your pharmacy.   Lab work: None If you have labs (blood work) drawn today and your tests are completely normal, you will receive your results only by: Marland Kitchen MyChart Message (if you have MyChart) OR . A paper copy in the mail If you have any lab test that is abnormal or we need to change your treatment, we will call you to review the results.  Testing/Procedures: None  Follow-Up: At [facility], you and your health needs are our priority.  As part of our continuing mission to provide you with exceptional heart care, we have created designated Provider Care Teams.  These Care Teams include your primary Cardiologist (physician) and Advanced Practice Providers (APPs -  Physician Assistants and Nurse Practitioners) who all work together to provide you with the care you need, when you need it. You will need a follow up appointment in 12 months.  Please call our office 2 months in advance to schedule this appointment.  You may see Sinclair Grooms, MD or one of the following Advanced Practice Providers on your designated Care Team:   Truitt Merle, NP Cecilie Kicks, NP . Kathyrn Drown, NP  Any Other Special Instructions Will Be Listed Below (If Applicable).

## 2019-05-24 ENCOUNTER — Other Ambulatory Visit: Payer: Self-pay | Admitting: Interventional Cardiology

## 2019-06-27 ENCOUNTER — Other Ambulatory Visit: Payer: Self-pay | Admitting: Interventional Cardiology

## 2019-10-31 ENCOUNTER — Other Ambulatory Visit: Payer: Self-pay | Admitting: Interventional Cardiology

## 2020-04-06 ENCOUNTER — Other Ambulatory Visit: Payer: Self-pay | Admitting: Interventional Cardiology

## 2020-04-08 ENCOUNTER — Other Ambulatory Visit: Payer: Self-pay | Admitting: Interventional Cardiology

## 2020-10-08 ENCOUNTER — Other Ambulatory Visit: Payer: Self-pay | Admitting: Interventional Cardiology

## 2020-10-10 MED ORDER — OLMESARTAN MEDOXOMIL 20 MG PO TABS: 20.0000 mg | ORAL_TABLET | Freq: Every day | ORAL | 0 refills | Status: DC

## 2020-11-30 ENCOUNTER — Ambulatory Visit: Payer: BLUE CROSS/BLUE SHIELD | Admitting: Interventional Cardiology

## 2020-12-04 NOTE — Progress Notes (Signed)
Cardiology Office Note:    Date:  12/05/2020   ID:  Cory Jenkins., DOB [birth year], MRN [id number]  PCP:  Dois Davenport, MD  Cardiologist:  Lesleigh Noe, MD   Referring MD: Dois Davenport, MD   Chief Complaint  Patient presents with  . Congestive Heart Failure    History of Present Illness:    Cory Quant. is a 55 y.o. male with a hx of typical atrial flutter, tachycardia related  Non-ischemic ardiomyopathy, EF 25%-->55% 03/2018 on med therapy after A. flutter ablation, obesity, and chronic anticoagulation therapy.  He is doing well.  Feels better than he has in years.  No shortness of breath, fatigue, orthopnea, PND, or peripheral edema. Gets 45 minutes of exercise 3 to 4 days each week.  Denies no syncope or presyncope.  Past Medical History:  Diagnosis Date  . Atrial flutter (HCC) 09/06/2017  . Chronic combined systolic and diastolic heart failure (HCC) 09/06/2017  . Claustrophobia 07/27/2015  . Depression   . Diverticulitis of sigmoid colon 02/16/2015  . Dyspnea on exertion 09/06/2017  . Essential hypertension 10/03/2017  . Hx of laparoscopic gastric banding 02/16/2015  . Hypertension   . Insomnia with sleep apnea 06/14/2015  . Morbid obesity due to excess calories (HCC) 06/14/2015  . New onset atrial flutter (HCC) 09/06/2017  . Obesity hypoventilation syndrome (HCC) 06/14/2015  . OSA (obstructive sleep apnea)    "never ordered mask" (09/06/2017)  . Sleep related hypoventilation/hypoxemia in other disease 07/27/2015    Past Surgical History:  Procedure Laterality Date  . A-FLUTTER ABLATION N/A 10/07/2017   Procedure: A-FLUTTER ABLATION;  Surgeon: Marinus Maw, MD;  Location[facility]VASIVE CV LAB;  Service: Cardiovascular;  Laterality: N/A;  . CARDIOVERSION N/A 09/09/2017   Procedure: CARDIOVERSION;  Surgeon: Wendall Stade, MD;  Location: [facility] ENDOSCOPY;  Service: Cardiovascular;  Laterality: N/A;  . HERNIA REPAIR    . LAPAROSCOPIC GASTRIC BANDING  ~ 2009   . TEE WITHOUT CARDIOVERSION N/A 09/09/2017   Procedure: TRANSESOPHAGEAL ECHOCARDIOGRAM (TEE);  Surgeon: Wendall Stade, MD;  Location: [facility] ENDOSCOPY;  Service: Cardiovascular;  Laterality: N/A;  . TESTICLE REMOVAL Left ~ 2007  . UMBILICAL HERNIA REPAIR  ~ 2009    Current Medications: Current Meds  Medication Sig  . aspirin EC 81 MG tablet Take 1 tablet (81 mg total) by mouth daily.  . carvedilol (COREG) 6.25 MG tablet TAKE 1 TABLET BY MOUTH TWICE A DAY  . metFORMIN (GLUCOPHAGE) 1000 MG tablet Take 1,000 mg by mouth 2 (two) times daily with a meal.  . olmesartan (BENICAR) 20 MG tablet Take 1 tablet (20 mg total) by mouth daily. Please keep upcoming appt in April 2022 with Dr. Katrinka Blazing before anymore refills. Thank you Final Attempt  . [DISCONTINUED] nortriptyline (PAMELOR) 25 MG capsule Take 75 mg by mouth at bedtime.   . [DISCONTINUED] zolpidem (AMBIEN) 10 MG tablet Take 10 mg by mouth at bedtime.     Allergies:   Keflex [cephalexin] and Sulfa antibiotics   Social History   Socioeconomic History  . Marital status: Married    Spouse name: Not on file  . Number of children: Not on file  . Years of education: Not on file  . Highest education level: Not on file  Occupational History  . Not on file  Tobacco Use  . Smoking status: Former Smoker    Packs/day: 1.00    Years: 35.00    Pack years: 35.00  Types: Cigarettes    Quit date: 09/06/2017    Years since quitting: 3.2  . Smokeless tobacco: Never Used  Vaping Use  . Vaping Use: Never used  Substance and Sexual Activity  . Alcohol use: No    Alcohol/week: 0.0 standard drinks  . Drug use: No  . Sexual activity: Yes  Other Topics Concern  . Not on file  Social History Narrative  . Not on file   Social Determinants of Health   Financial Resource Strain: Not on file  Food Insecurity: Not on file  Transportation Needs: Not on file  Physical Activity: Not on file  Stress: Not on file  Social Connections: Not on file      Family History: The patient's family history includes Healthy in his father.  ROS:   Please see the history of present illness.    No angina, syncope, or other problems.  All other systems reviewed and are negative.  EKGs/Labs/Other Studies Reviewed:    The following studies were reviewed today: 2D Doppler echocardiogram 2019: Study Conclusions   - Left ventricle: The cavity size was normal. Systolic function was  normal. The estimated ejection fraction was in the range of 55%  to 60%. Wall motion was normal; there were no regional wall  motion abnormalities. Doppler parameters are consistent with  abnormal left ventricular relaxation (grade 1 diastolic  dysfunction).  - Aortic valve: There was no stenosis.  - Mitral valve: There was no significant regurgitation.  - Right ventricle: The cavity size was normal. Systolic function  was normal.  - Pulmonary arteries: No complete TR doppler jet so unable to  estimate PA systolic pressure.  - Inferior vena cava: The vessel was normal in size. The  respirophasic diameter changes were in the normal range (>= 50%),  consistent with normal central venous pressure.   Impressions:   - Normal LV size with EF 55-60%. Normal RV size and systolic  function. No signifcant valvular abnormalities.    EKG:  EKG EKG reveals left atrial abnormality but otherwise normal.  Recent Labs: No results found for requested labs within last 8760 hours.  Recent Lipid Panel    Component Value Date/Time   CHOL 142 09/07/2017 0626   TRIG 96 09/07/2017 0626   HDL 27 (L) 09/07/2017 0626   CHOLHDL 5.3 09/07/2017 0626   VLDL 19 09/07/2017 0626   LDLCALC 96 09/07/2017 0626    Physical Exam:    VS:  BP 130/90   Pulse 76   Ht 6' (1.829 m)   Wt 228 lb (103.4 kg)   SpO2 96%   BMI 30.92 kg/m     Wt Readings from Last 3 Encounters:  12/05/20 228 lb (103.4 kg)  03/27/19 240 lb 3.2 oz (109 kg)  03/18/19 286 lb (129.7 kg)      GEN: Moderate obesity. No acute distress HEENT: Normal NECK: No JVD. LYMPHATICS: No lymphadenopathy CARDIAC: No murmur. RRR no gallop, or edema. VASCULAR:  Normal Pulses. No bruits. RESPIRATORY:  Clear to auscultation without rales, wheezing or rhonchi  ABDOMEN: Soft, non-tender, non-distended, No pulsatile mass, MUSCULOSKELETAL: No deformity  SKIN: Warm and dry NEUROLOGIC:  Alert and oriented x 3 PSYCHIATRIC:  Normal affect   ASSESSMENT:    1. Chronic combined systolic and diastolic heart failure (HCC)   2. Typical atrial flutter (HCC)   3. Essential hypertension   4. OSA (obstructive sleep apnea)   5. S/P ablation of atrial flutter    PLAN:    In order  of problems listed above:  1.  Needs a repeat echocardiogram performed prior to the next office visit in 1 year.\ 2. No clinical recurrence after ablation. 3. Diastolic is a Sporrer high today but he assures me that when he monitors at home, he is usually in the 120/80 mmHg range. 4. Encouraged that he use CPAP.  Clinic follow-up in 1 year with echo in front.  Mediterranean diet with low carbohydrate intake.  2.5 g sodium diet.   Medication Adjustments/Labs and Tests Ordered: Current medicines are reviewed at length with the patient today.  Concerns regarding medicines are outlined above.  Orders Placed This Encounter  Procedures  . EKG 12-Lead   No orders of the defined types were placed in this encounter.   There are no Patient Instructions on file for this visit.   Signed, Lesleigh Noe, MD  12/05/2020 2:52 PM    [place] [facility]

## 2020-12-05 ENCOUNTER — Other Ambulatory Visit: Payer: Self-pay

## 2020-12-05 ENCOUNTER — Ambulatory Visit (INDEPENDENT_AMBULATORY_CARE_PROVIDER_SITE_OTHER): Payer: 59 | Admitting: Interventional Cardiology

## 2020-12-05 ENCOUNTER — Encounter: Payer: Self-pay | Admitting: Interventional Cardiology

## 2020-12-05 VITALS — BP 130/90 | HR 76 | Ht 72.0 in | Wt 228.0 lb

## 2020-12-05 DIAGNOSIS — Z9889 Other specified postprocedural states: Secondary | ICD-10-CM

## 2020-12-05 DIAGNOSIS — G4733 Obstructive sleep apnea (adult) (pediatric): Secondary | ICD-10-CM | POA: Diagnosis not present

## 2020-12-05 DIAGNOSIS — I483 Typical atrial flutter: Secondary | ICD-10-CM | POA: Diagnosis not present

## 2020-12-05 DIAGNOSIS — I10 Essential (primary) hypertension: Secondary | ICD-10-CM | POA: Diagnosis not present

## 2020-12-05 DIAGNOSIS — I5042 Chronic combined systolic (congestive) and diastolic (congestive) heart failure: Secondary | ICD-10-CM

## 2020-12-05 DIAGNOSIS — Z8679 Personal history of other diseases of the circulatory system: Secondary | ICD-10-CM

## 2020-12-05 NOTE — Patient Instructions (Signed)
Medication Instructions:  Your physician recommends that you continue on your current medications as directed. Please refer to the Current Medication list given to you today.  *If you need a refill on your cardiac medications before your next appointment, please call your pharmacy*   Lab Work: none If you have labs (blood work) drawn today and your tests are completely normal, you will receive your results only by: Marland Kitchen MyChart Message (if you have MyChart) OR . A paper copy in the mail If you have any lab test that is abnormal or we need to change your treatment, we will call you to review the results.   Testing/Procedures: Your physician has requested that you have an echocardiogram  1 month prior to seeing Dr. Katrinka Blazing in one year.. Echocardiography is a painless test that uses sound waves to create images of your heart. It provides your doctor with information about the size and shape of your heart and how well your heart's chambers and valves are working. This procedure takes approximately one hour. There are no restrictions for this procedure.     Follow-Up: At [facility], you and your health needs are our priority.  As part of our continuing mission to provide you with exceptional heart care, we have created designated Provider Care Teams.  These Care Teams include your primary Cardiologist (physician) and Advanced Practice Providers (APPs -  Physician Assistants and Nurse Practitioners) who all work together to provide you with the care you need, when you need it.  We recommend signing up for the patient portal called "MyChart".  Sign up information is provided on this After Visit Summary.  MyChart is used to connect with patients for Virtual Visits (Telemedicine).  Patients are able to view lab/test results, encounter notes, upcoming appointments, etc.  Non-urgent messages can be sent to your provider as well.   To learn more about what you can do with MyChart, go to  [email or web address].    Your next appointment:   1 year(s)  The format for your next appointment:   In Person  Provider:   You may see Lesleigh Noe, MD or one of the following Advanced Practice Providers on your designated Care Team:    Georgie Chard, NP    Other Instructions  Mediterranean Diet A Mediterranean diet refers to food and lifestyle choices that are based on the traditions of countries located on the [place]. This way of eating has been shown to help prevent certain conditions and improve outcomes for people who have chronic diseases, like kidney disease and heart disease. What are tips for following this plan? Lifestyle  Cook and eat meals together with your family, when possible.  Drink enough fluid to keep your urine clear or pale yellow.  Be physically active every day. This includes: ? Aerobic exercise like running or swimming. ? Leisure activities like gardening, walking, or housework.  Get 7-8 hours of sleep each night.  If recommended by your health care provider, drink red wine in moderation. This means 1 glass a day for nonpregnant women and 2 glasses a day for men. A glass of wine equals 5 oz (150 mL). Reading food labels  Check the serving size of packaged foods. For foods such as rice and pasta, the serving size refers to the amount of cooked product, not dry.  Check the total fat in packaged foods. Avoid foods that have saturated fat or trans fats.  Check the ingredients list for added sugars, such as corn syrup.  Shopping  At the grocery store, buy most of your food from the areas near the walls of the store. This includes: ? Fresh fruits and vegetables (produce). ? Grains, beans, nuts, and seeds. Some of these may be available in unpackaged forms or large amounts (in bulk). ? Fresh seafood. ? Poultry and eggs. ? Low-fat dairy products.  Buy whole ingredients instead of prepackaged foods.  Buy fresh fruits and  vegetables in-season from local farmers markets.  Buy frozen fruits and vegetables in resealable bags.  If you do not have access to quality fresh seafood, buy precooked frozen shrimp or canned fish, such as tuna, salmon, or sardines.  Buy small amounts of raw or cooked vegetables, salads, or olives from the deli or salad bar at your store.  Stock your pantry so you always have certain foods on hand, such as olive oil, canned tuna, canned tomatoes, rice, pasta, and beans. Cooking  Cook foods with extra-virgin olive oil instead of using butter or other vegetable oils.  Have meat as a side dish, and have vegetables or grains as your main dish. This means having meat in small portions or adding small amounts of meat to foods like pasta or stew.  Use beans or vegetables instead of meat in common dishes like chili or lasagna.  Experiment with different cooking methods. Try roasting or broiling vegetables instead of steaming or sauteing them.  Add frozen vegetables to soups, stews, pasta, or rice.  Add nuts or seeds for added healthy fat at each meal. You can add these to yogurt, salads, or vegetable dishes.  Marinate fish or vegetables using olive oil, lemon juice, garlic, and fresh herbs. Meal planning  Plan to eat 1 vegetarian meal one day each week. Try to work up to 2 vegetarian meals, if possible.  Eat seafood 2 or more times a week.  Have healthy snacks readily available, such as: ? Vegetable sticks with hummus. ? [place] yogurt. ? Fruit and nut trail mix.  Eat balanced meals throughout the week. This includes: ? Fruit: 2-3 servings a day ? Vegetables: 4-5 servings a day ? Low-fat dairy: 2 servings a day ? Fish, poultry, or lean meat: 1 serving a day ? Beans and legumes: 2 or more servings a week ? Nuts and seeds: 1-2 servings a day ? Whole grains: 6-8 servings a day ? Extra-virgin olive oil: 3-4 servings a day  Limit red meat and sweets to only a few servings a month    What are my food choices?  Mediterranean diet ? Recommended  Grains: Whole-grain pasta. Brown rice. Bulgar wheat. Polenta. Couscous. Whole-wheat bread. Orpah Cobb.  Vegetables: Artichokes. Beets. Broccoli. Cabbage. Carrots. Eggplant. Green beans. Chard. Kale. Spinach. Onions. Leeks. Peas. Squash. Tomatoes. Peppers. Radishes.  Fruits: Apples. Apricots. Avocado. Berries. Bananas. Cherries. Dates. Figs. Grapes. Lemons. Melon. Oranges. Peaches. Plums. Pomegranate.  Meats and other protein foods: Beans. Almonds. Sunflower seeds. Pine nuts. Peanuts. Cod. Salmon. Scallops. Shrimp. Tuna. Tilapia. Clams. Oysters. Eggs.  Dairy: Low-fat milk. Cheese. Greek yogurt.  Beverages: Water. Red wine. Herbal tea.  Fats and oils: Extra virgin olive oil. Avocado oil. Grape seed oil.  Sweets and desserts: [place] yogurt with honey. Baked apples. Poached pears. Trail mix.  Seasoning and other foods: Basil. Cilantro. Coriander. Cumin. Mint. Parsley. Sage. Rosemary. Tarragon. Garlic. Oregano. Thyme. Pepper. Balsalmic vinegar. Tahini. Hummus. Tomato sauce. Olives. Mushrooms. ? Limit these  Grains: Prepackaged pasta or rice dishes. Prepackaged cereal with added sugar.  Vegetables: Deep fried potatoes (french fries).  Fruits: Fruit canned in syrup.  Meats and other protein foods: Beef. Pork. Lamb. Poultry with skin. Hot dogs. Tomasa Blase.  Dairy: Ice cream. Sour cream. Whole milk.  Beverages: Juice. Sugar-sweetened soft drinks. Beer. Liquor and spirits.  Fats and oils: Butter. Canola oil. Vegetable oil. Beef fat (tallow). Lard.  Sweets and desserts: Cookies. Cakes. Pies. Candy.  Seasoning and other foods: Mayonnaise. Premade sauces and marinades. The items listed may not be a complete list. Talk with your dietitian about what dietary choices are right for you. Summary  The Mediterranean diet includes both food and lifestyle choices.  Eat a variety of fresh fruits and vegetables, beans, nuts, seeds,  and whole grains.  Limit the amount of red meat and sweets that you eat.  Talk with your health care provider about whether it is safe for you to drink red wine in moderation. This means 1 glass a day for nonpregnant women and 2 glasses a day for men. A glass of wine equals 5 oz (150 mL). This information is not intended to replace advice given to you by your health care provider. Make sure you discuss any questions you have with your health care provider. Document Revised: 03/29/2016 Document Reviewed: 03/22/2016 Elsevier Patient Education  2020 [employer].

## 2021-01-16 ENCOUNTER — Other Ambulatory Visit: Payer: Self-pay | Admitting: Interventional Cardiology

## 2021-04-13 ENCOUNTER — Other Ambulatory Visit: Payer: Self-pay | Admitting: Interventional Cardiology

## 2021-09-28 NOTE — Addendum Note (Signed)
Addended by: Julio Sicks on: 09/28/2021 11:53 AM   Modules accepted: Orders

## 2021-10-20 ENCOUNTER — Other Ambulatory Visit: Payer: Self-pay | Admitting: Interventional Cardiology

## 2021-11-28 ENCOUNTER — Other Ambulatory Visit: Payer: Self-pay | Admitting: Interventional Cardiology

## 2021-12-26 ENCOUNTER — Ambulatory Visit (HOSPITAL_COMMUNITY): Payer: 59 | Attending: Internal Medicine

## 2021-12-26 DIAGNOSIS — I5042 Chronic combined systolic (congestive) and diastolic (congestive) heart failure: Secondary | ICD-10-CM | POA: Diagnosis present

## 2021-12-26 LAB — ECHOCARDIOGRAM COMPLETE
Area-P 1/2: 3.81 cm2
S' Lateral: 3.4 cm

## 2022-04-09 ENCOUNTER — Other Ambulatory Visit: Payer: Self-pay | Admitting: Interventional Cardiology

## 2022-05-16 NOTE — Progress Notes (Unsigned)
Cardiology Office Note:    Date:  05/17/2022   ID:  Cory Market., DOB 1966-03-04, MRN [id number]  PCP:  Cory Davenport, MD  Cardiologist:  Cory Noe, MD   Referring MD: Cory Davenport, MD   Chief Complaint  Patient presents with   Congestive Heart Failure   Atrial Fibrillation   Hypertension   Hyperlipidemia    History of Present Illness:    Cory Jenkins. is a 56 y.o. male with a hx of of typical atrial flutter, tachycardia related  Non-ischemic ardiomyopathy, EF 25%-->55% 03/2018 on med therapy after A. flutter ablation, obesity, and chronic anticoagulation therapy converted to aspirin after normalization of LV function and ablation of atrial arrhythmia in 2022.   Doing well.  Denies any cardiac complaints.  Some of the medication and we had a moment LV function without prevent having a climax related to sexual intercourse.  He prefer to have higher blood pressure than to be on the medication that interferes with ejaculation.  He is not sure which medication it was.  He has been noticing blood pressures have been elevated.  We discussed management strategies which will include adding a very low-dose diuretic and or an SGLT2 to get volume off.  Past Medical History:  Diagnosis Date   Atrial flutter (HCC) 09/06/2017   Chronic combined systolic and diastolic heart failure (HCC) 09/06/2017   Claustrophobia 07/27/2015   Depression    Diverticulitis of sigmoid colon 02/16/2015   Dyspnea on exertion 09/06/2017   Essential hypertension 10/03/2017   Hx of laparoscopic gastric banding 02/16/2015   Hypertension    Insomnia with sleep apnea 06/14/2015   Morbid obesity due to excess calories (HCC) 06/14/2015   New onset atrial flutter (HCC) 09/06/2017   Obesity hypoventilation syndrome (HCC) 06/14/2015   OSA (obstructive sleep apnea)    "never ordered mask" (09/06/2017)   Sleep related hypoventilation/hypoxemia in other disease 07/27/2015    Past Surgical History:   Procedure Laterality Date   A-FLUTTER ABLATION N/A 10/07/2017   Procedure: A-FLUTTER ABLATION;  Surgeon: Marinus Maw, MD;  Location: [facility] INVASIVE CV LAB;  Service: Cardiovascular;  Laterality: N/A;   CARDIOVERSION N/A 09/09/2017   Procedure: CARDIOVERSION;  Surgeon: Wendall Stade, MD;  Location: [facility] ENDOSCOPY;  Service: Cardiovascular;  Laterality: N/A;   HERNIA REPAIR     LAPAROSCOPIC GASTRIC BANDING  ~ 2009   TEE WITHOUT CARDIOVERSION N/A 09/09/2017   Procedure: TRANSESOPHAGEAL ECHOCARDIOGRAM (TEE);  Surgeon: Wendall Stade, MD;  Location: [facility] ENDOSCOPY;  Service: Cardiovascular;  Laterality: N/A;   TESTICLE REMOVAL Left ~ 2007   UMBILICAL HERNIA REPAIR  ~ 2009    Current Medications: Current Meds  Medication Sig   aspirin EC 81 MG tablet Take 1 tablet (81 mg total) by mouth daily.   carvedilol (COREG) 6.25 MG tablet TAKE 1 TABLET BY MOUTH TWICE A DAY   doxycycline (VIBRAMYCIN) 50 MG capsule Take 50 mg by mouth daily.   hydrochlorothiazide (MICROZIDE) 12.5 MG capsule Take 1 capsule (12.5 mg total) by mouth daily.   ketoconazole (NIZORAL) 2 % shampoo Apply topically 2 (two) times a week.   metFORMIN (GLUCOPHAGE) 1000 MG tablet Take 1,000 mg by mouth 2 (two) times daily with a meal.   olmesartan (BENICAR) 20 MG tablet TAKE 1 TABLET BY MOUTH EVERY DAY   triamcinolone cream (KENALOG) 0.1 % Apply topically 3 (three) times daily.   Vitamin D, Ergocalciferol, (DRISDOL) 1.25 MG (50000 UNIT) CAPS capsule Take 50,000  Units by mouth once a week.     Allergies:   Keflex [cephalexin] and Sulfa antibiotics   Social History   Socioeconomic History   Marital status: Married    Spouse name: Not on file   Number of children: Not on file   Years of education: Not on file   Highest education level: Not on file  Occupational History   Not on file  Tobacco Use   Smoking status: Former    Packs/day: 1.00    Years: 35.00    Total pack years: 35.00    Types: Cigarettes    Quit date:  09/06/2017    Years since quitting: 4.6   Smokeless tobacco: Never  Vaping Use   Vaping Use: Never used  Substance and Sexual Activity   Alcohol use: No    Alcohol/week: 0.0 standard drinks of alcohol   Drug use: No   Sexual activity: Yes  Other Topics Concern   Not on file  Social History Narrative   Not on file   Social Determinants of Health   Financial Resource Strain: Not on file  Food Insecurity: Not on file  Transportation Needs: Not on file  Physical Activity: Not on file  Stress: Not on file  Social Connections: Not on file     Family History: The patient's family history includes Healthy in his father.  ROS:   Please see the history of present illness.    He is doing great.  Not getting exercise.  Not eating properly.  He acknowledges all these things and is trying to do better.  All other systems reviewed and are negative.  EKGs/Labs/Other Studies Reviewed:    The following studies were reviewed today: Doppler echocardiogram May 2023: IMPRESSIONS     1. Left ventricular ejection fraction, by estimation, is 55 to 60%. The  left ventricle has normal function. The left ventricle has no regional  wall motion abnormalities. Left ventricular diastolic parameters are  consistent with Grade I diastolic  dysfunction (impaired relaxation). The average left ventricular global  longitudinal strain is -17.5 %. The global longitudinal strain is mildly  abnormal.   2. Right ventricular systolic function is normal. The right ventricular  size is normal.   3. The mitral valve is grossly normal. Trivial mitral valve  regurgitation.   4. The aortic valve is tricuspid. Aortic valve regurgitation is not  visualized.   Comparison(s): No significant change from prior study. 03/24/2018: LVEF  55-60%.     EKG:  EKG biatrial abnormality, otherwise normal.  Recent Labs: No results found for requested labs within last 365 days.  Recent Lipid Panel    Component Value  Date/Time   CHOL 142 09/07/2017 0626   TRIG 96 09/07/2017 0626   HDL 27 (L) 09/07/2017 0626   CHOLHDL 5.3 09/07/2017 0626   VLDL 19 09/07/2017 0626   LDLCALC 96 09/07/2017 0626    Physical Exam:    VS:  BP (!) 124/98   Pulse 81   Ht 6' (1.829 m)   Wt 238 lb 3.2 oz (108 kg)   SpO2 97%   BMI 32.31 kg/m     Wt Readings from Last 3 Encounters:  05/17/22 238 lb 3.2 oz (108 kg)  12/05/20 228 lb (103.4 kg)  03/27/19 240 lb 3.2 oz (109 kg)     GEN: Overweight. No acute distress HEENT: Normal NECK: No JVD. LYMPHATICS: No lymphadenopathy CARDIAC: No murmur. RRR no gallop, or edema. VASCULAR:  Normal Pulses. No bruits. RESPIRATORY:  Clear to auscultation without rales, wheezing or rhonchi  ABDOMEN: Soft, non-tender, non-distended, No pulsatile mass, MUSCULOSKELETAL: No deformity  SKIN: Warm and dry NEUROLOGIC:  Alert and oriented x 3 PSYCHIATRIC:  Normal affect   ASSESSMENT:    1. Chronic combined systolic and diastolic heart failure (HCC)   2. Typical atrial flutter (HCC)   3. Essential hypertension   4. OSA (obstructive sleep apnea)   5. S/P ablation of atrial flutter    PLAN:    In order of problems listed above:  Resolved to normal systolic function.  Recent echo in May demonstrated EF 60%. Resolved after ablation Add HCTZ 12.5 mg/day.  Continue Benicar and carvedilol.  If he has erectile problems on HCTZ another consideration would be to discontinue the diuretic and add [place] to [place].  Target blood pressure 130/80 mmHg.  Discussed exercise, low-salt diet, weight loss as well. We did not discuss sleep apnea.  He will be encouraged to wear CPAP.  Guideline directed therapy for left ventricular systolic dysfunction: Angiotensin receptor-neprilysin inhibitor (ARNI)-Entresto; beta-blocker therapy - carvedilol, metoprolol succinate, or bisoprolol; mineralocorticoid receptor antagonist (MRA) therapy -spironolactone or eplerenone.  SGLT-2 agents -  Dapagliflozin  Marcelline Deist) or Empagliflozin (Jardiance).These therapies have been shown to improve clinical outcomes including reduction of rehospitalization, survival, and acute heart failure.  Recommended that he follow-up with Dr. Rachelle Hora Croitoru in 9 to 12 months.  (Patient is a son of Dr. Thereasa Solo Wickersham).   Medication Adjustments/Labs and Tests Ordered: Current medicines are reviewed at length with the patient today.  Concerns regarding medicines are outlined above.  Orders Placed This Encounter  Procedures   EKG 12-Lead   Meds ordered this encounter  Medications   hydrochlorothiazide (MICROZIDE) 12.5 MG capsule    Sig: Take 1 capsule (12.5 mg total) by mouth daily.    Dispense:  90 capsule    Refill:  3    Patient Instructions  Medication Instructions:  Your physician has recommended you make the following change in your medication:   1) START hydrochlorothiazide (HCTZ) 12.5mg  daily  After about 1 week of starting HCTZ check your blood pressure at home 2-3 times per week (at least 2 hours after taking your medication) and call results to [place] in 1-2 weeks at [phone]. You may send your results via MyChart message if you prefer.  *If you need a refill on your cardiac medications before your next appointment, please call your pharmacy*  Lab Work: Please have a BMET checked (for kidney function) at Dr. Wendee Copp office in 2-4 weeks and send results to our office at [phone].  Testing/Procedures: NONE  Follow-Up: At [facility], you and your health needs are our priority.  As part of our continuing mission to provide you with exceptional heart care, we have created designated Provider Care Teams.  These Care Teams include your primary Cardiologist (physician) and Advanced Practice Providers (APPs -  Physician Assistants and Nurse Practitioners) who all work together to provide you with the care you need, when you need it.  Your next appointment:   1 year(s)  The format for  your next appointment:   In Person  Provider:   Thurmon Fair, MD   Important Information About Sugar         Signed, Cory Noe, MD  05/17/2022 11:02 AM    [facility]

## 2022-05-17 ENCOUNTER — Ambulatory Visit: Payer: 59 | Attending: Interventional Cardiology | Admitting: Interventional Cardiology

## 2022-05-17 ENCOUNTER — Encounter: Payer: Self-pay | Admitting: Interventional Cardiology

## 2022-05-17 VITALS — BP 124/98 | HR 81 | Ht 72.0 in | Wt 238.2 lb

## 2022-05-17 DIAGNOSIS — G4733 Obstructive sleep apnea (adult) (pediatric): Secondary | ICD-10-CM

## 2022-05-17 DIAGNOSIS — I10 Essential (primary) hypertension: Secondary | ICD-10-CM

## 2022-05-17 DIAGNOSIS — Z9889 Other specified postprocedural states: Secondary | ICD-10-CM

## 2022-05-17 DIAGNOSIS — Z8679 Personal history of other diseases of the circulatory system: Secondary | ICD-10-CM

## 2022-05-17 DIAGNOSIS — I483 Typical atrial flutter: Secondary | ICD-10-CM | POA: Diagnosis not present

## 2022-05-17 DIAGNOSIS — I5042 Chronic combined systolic (congestive) and diastolic (congestive) heart failure: Secondary | ICD-10-CM | POA: Diagnosis not present

## 2022-05-17 MED ORDER — HYDROCHLOROTHIAZIDE 12.5 MG PO CAPS: 12.5000 mg | ORAL_CAPSULE | Freq: Every day | ORAL | 3 refills | Status: DC

## 2022-05-17 NOTE — Patient Instructions (Signed)
Medication Instructions:  Your physician has recommended you make the following change in your medication:   1) START hydrochlorothiazide (HCTZ) 12.5mg  daily  After about 1 week of starting HCTZ check your blood pressure at home 2-3 times per week (at least 2 hours after taking your medication) and call results to [place] in 1-2 weeks at [phone]. You may send your results via MyChart message if you prefer.  *If you need a refill on your cardiac medications before your next appointment, please call your pharmacy*  Lab Work: Please have a BMET checked (for kidney function) at Dr. Dennie Fetters office in 2-4 weeks and send results to our office at [phone].  Testing/Procedures: NONE  Follow-Up: At [facility] you and your health needs are our priority.  As part of our continuing mission to provide you with exceptional heart care, we have created designated Provider Care Teams.  These Care Teams include your primary Cardiologist (physician) and Advanced Practice Providers (APPs -  Physician Assistants and Nurse Practitioners) who all work together to provide you with the care you need, when you need it.  Your next appointment:   1 year(s)  The format for your next appointment:   In Person  Provider:   Sanda Klein, MD   Important Information About Sugar

## 2022-07-07 ENCOUNTER — Other Ambulatory Visit: Payer: Self-pay | Admitting: Interventional Cardiology

## 2022-11-27 ENCOUNTER — Other Ambulatory Visit: Payer: Self-pay | Admitting: *Deleted

## 2022-11-27 MED ORDER — OLMESARTAN MEDOXOMIL 20 MG PO TABS: 20.0000 mg | ORAL_TABLET | Freq: Every day | ORAL | 2 refills | Status: DC

## 2023-03-18 ENCOUNTER — Other Ambulatory Visit: Payer: Self-pay

## 2023-03-18 MED ORDER — HYDROCHLOROTHIAZIDE 12.5 MG PO CAPS: 12.5000 mg | ORAL_CAPSULE | Freq: Every day | ORAL | 0 refills | Status: DC

## 2023-08-16 ENCOUNTER — Other Ambulatory Visit: Payer: Self-pay | Admitting: Cardiovascular Disease

## 2023-08-26 ENCOUNTER — Other Ambulatory Visit: Payer: Self-pay | Admitting: Cardiovascular Disease

## 2023-10-25 ENCOUNTER — Other Ambulatory Visit: Payer: Self-pay | Admitting: Cardiovascular Disease

## 2023-10-25 MED ORDER — HYDROCHLOROTHIAZIDE 12.5 MG PO CAPS: 12.5000 mg | ORAL_CAPSULE | Freq: Every day | ORAL | 0 refills | Status: AC
